# Patient Record
Sex: Female | Born: 1935 | Race: White | Hispanic: Yes | Smoking: Never smoker
Health system: Southern US, Community
[De-identification: ages and names within clinical notes are randomized; demographics above are authoritative.]

## PROBLEM LIST (undated history)

## (undated) DIAGNOSIS — E7800 Pure hypercholesterolemia, unspecified: Secondary | ICD-10-CM

## (undated) DIAGNOSIS — A048 Other specified bacterial intestinal infections: Secondary | ICD-10-CM

## (undated) DIAGNOSIS — J45909 Unspecified asthma, uncomplicated: Secondary | ICD-10-CM

## (undated) DIAGNOSIS — E079 Disorder of thyroid, unspecified: Secondary | ICD-10-CM

## (undated) DIAGNOSIS — N815 Vaginal enterocele: Secondary | ICD-10-CM

## (undated) DIAGNOSIS — R1900 Intra-abdominal and pelvic swelling, mass and lump, unspecified site: Secondary | ICD-10-CM

## (undated) DIAGNOSIS — N904 Leukoplakia of vulva: Secondary | ICD-10-CM

## (undated) DIAGNOSIS — E039 Hypothyroidism, unspecified: Secondary | ICD-10-CM

## (undated) DIAGNOSIS — J309 Allergic rhinitis, unspecified: Secondary | ICD-10-CM

## (undated) DIAGNOSIS — K439 Ventral hernia without obstruction or gangrene: Secondary | ICD-10-CM

## (undated) DIAGNOSIS — M810 Age-related osteoporosis without current pathological fracture: Secondary | ICD-10-CM

## (undated) DIAGNOSIS — K219 Gastro-esophageal reflux disease without esophagitis: Secondary | ICD-10-CM

## (undated) HISTORY — PX: ABDOMINAL HYSTERECTOMY: SHX81

## (undated) HISTORY — DX: Pure hypercholesterolemia, unspecified: E78.00

## (undated) HISTORY — PX: HERNIA REPAIR: SHX51

## (undated) HISTORY — DX: Disorder of thyroid, unspecified: E07.9

## (undated) HISTORY — DX: Gastro-esophageal reflux disease without esophagitis: K21.9

## (undated) HISTORY — DX: Leukoplakia of vulva: N90.4

## (undated) HISTORY — DX: Age-related osteoporosis without current pathological fracture: M81.0

## (undated) HISTORY — DX: Allergic rhinitis, unspecified: J30.9

## (undated) HISTORY — DX: Intra-abdominal and pelvic swelling, mass and lump, unspecified site: R19.00

## (undated) HISTORY — DX: Unspecified asthma, uncomplicated: J45.909

## (undated) HISTORY — DX: Vaginal enterocele: N81.5

## (undated) HISTORY — DX: Ventral hernia without obstruction or gangrene: K43.9

## (undated) HISTORY — DX: Other specified bacterial intestinal infections: A04.8

---

## 1969-05-31 HISTORY — PX: BREAST EXCISIONAL BIOPSY: SUR124

## 2013-06-15 ENCOUNTER — Ambulatory Visit: Payer: Self-pay | Admitting: Obstetrics and Gynecology

## 2013-07-27 ENCOUNTER — Ambulatory Visit: Payer: Self-pay | Admitting: Ophthalmology

## 2013-08-03 ENCOUNTER — Ambulatory Visit: Payer: Self-pay | Admitting: Ophthalmology

## 2013-08-27 ENCOUNTER — Emergency Department: Payer: Self-pay | Admitting: Emergency Medicine

## 2013-08-27 LAB — BASIC METABOLIC PANEL
Anion Gap: 5 — ABNORMAL LOW (ref 7–16)
Calcium, Total: 8.9 mg/dL (ref 8.5–10.1)
Creatinine: 0.75 mg/dL (ref 0.60–1.30)
EGFR (Non-African Amer.): >60
Glucose: 125 mg/dL — ABNORMAL HIGH (ref 65–99)
Potassium: 3.8 mmol/L (ref 3.5–5.1)
Sodium: 137 mmol/L (ref 136–145)

## 2013-08-27 LAB — CBC
HCT: 41 % (ref 35.0–47.0)
Platelet: 285 10*3/uL (ref 150–440)
RBC: 4.73 10*6/uL (ref 3.80–5.20)
WBC: 6.8 10*3/uL (ref 3.6–11.0)

## 2013-08-27 LAB — URINALYSIS, COMPLETE
Bacteria: NONE SEEN
Glucose,UR: NEGATIVE mg/dL (ref 0–75)
Ketone: NEGATIVE
Leukocyte Esterase: NEGATIVE
Nitrite: NEGATIVE
Protein: NEGATIVE
RBC,UR: 8 /HPF (ref 0–5)
Specific Gravity: 1.013 (ref 1.003–1.030)
Squamous Epithelial: <1
WBC UR: 1 /HPF (ref 0–5)

## 2013-08-27 LAB — TROPONIN I: Troponin-I: <0.02 ng/mL

## 2013-10-12 ENCOUNTER — Ambulatory Visit: Payer: Self-pay | Admitting: Ophthalmology

## 2014-03-16 ENCOUNTER — Ambulatory Visit: Payer: Self-pay | Admitting: Internal Medicine

## 2015-01-20 NOTE — Op Note (Signed)
PATIENT NAME:  Cassandra Gomez, Cassandra Gomez MR#:  [id number] DATE OF BIRTH:  06/06/1936  DATE OF PROCEDURE:  08/03/2013  PREOPERATIVE DIAGNOSIS: Visually significant cataract of the right eye.   POSTOPERATIVE DIAGNOSIS: Visually significant cataract of the right eye.   OPERATIVE PROCEDURE: Cataract extraction by phacoemulsification with implant of intraocular lens to right eye.   SURGEON: Galen ManilaWilliam Estephan Gallardo, MD.   ANESTHESIA:  1. Managed anesthesia care.  2. Topical tetracaine drops followed by 2% Xylocaine jelly applied in the preoperative holding area.   COMPLICATIONS: None.   TECHNIQUE:  Stop and chop.  DESCRIPTION OF PROCEDURE: The patient was examined and consented in the preoperative holding area where the aforementioned topical anesthesia was applied to the right eye and then brought back to the Operating Room where the right eye was prepped and draped in the usual sterile ophthalmic fashion and a lid speculum was placed. A paracentesis was created with the side port blade and the anterior chamber was filled with viscoelastic. A near clear corneal incision was performed with the steel keratome. A continuous curvilinear capsulorrhexis was performed with a cystotome followed by the capsulorrhexis forceps. Hydrodissection and hydrodelineation were carried out with BSS on a blunt cannula. The lens was removed in a stop and chop technique and the remaining cortical material was removed with the irrigation-aspiration handpiece. The capsular bag was inflated with viscoelastic and the Tecnis ZCB00 23.0-diopter lens, serial number [id number] was placed in the capsular bag without complication. The remaining viscoelastic was removed from the eye with the irrigation-aspiration handpiece. The wounds were hydrated. The anterior chamber was flushed with Miostat and the eye was inflated to physiologic pressure. 0.1 mL of cefuroxime concentration 10 mg/mL was placed in the anterior chamber. The wounds were found to  be water tight. The eye was dressed with Vigamox. The patient was given protective glasses to wear throughout the day and a shield with which to sleep tonight. The patient was also given drops with which to begin a drop regimen today and will follow-up with me in one day.     ____________________________ Cassandra FieldWilliam L. Kiarra Kidd, MD wlp:dmm D: 08/03/2013 21:22:11 ET T: 08/03/2013 21:46:15 ET JOB#: [id number]  cc: Ishanvi Mcquitty L. Nell Gales, MD, <Dictator> Cassandra FieldWILLIAM L Ilani Otterson MD ELECTRONICALLY SIGNED 08/06/2013 11:25

## 2015-01-21 NOTE — Op Note (Signed)
PATIENT NAME:  Cassandra Gomez, Cassandra Gomez MR#:  [id number] DATE OF BIRTH:  1935/11/23  DATE OF PROCEDURE:  10/12/2013  PREOPERATIVE DIAGNOSIS: Visually significant cataract of the left eye.   POSTOPERATIVE DIAGNOSIS: Visually significant cataract of the left eye.   OPERATIVE PROCEDURE: Cataract extraction by phacoemulsification with implant of intraocular lens to left eye.   SURGEON: Cassandra ManilaWilliam Ninfa Giannelli, MD.   ANESTHESIA:  1. Managed anesthesia care.  2. Topical tetracaine drops followed by 2% Xylocaine jelly applied in the preoperative holding area.   COMPLICATIONS: None.   TECHNIQUE:   Stop and chop  DESCRIPTION OF PROCEDURE: The patient was examined and consented in the preoperative holding area where the aforementioned topical anesthesia was applied to the left eye and then brought back to the Operating Room where the left eye was prepped and draped in the usual sterile ophthalmic fashion and a lid speculum was placed. A paracentesis was created with the side port blade and the anterior chamber was filled with viscoelastic. A near clear corneal incision was performed with the steel keratome. A continuous curvilinear capsulorrhexis was performed with a cystotome followed by the capsulorrhexis forceps. Hydrodissection and hydrodelineation were carried out with BSS on a blunt cannula. The lens was removed in a stop and chop technique and the remaining cortical material was removed with the irrigation-aspiration handpiece. The capsular bag was inflated with viscoelastic and the Alcon SN60WF Tecnis ZCB00 23.0-diopter lens, serial number [id number] was placed in the capsular bag without complication. The remaining viscoelastic was removed from the eye with the irrigation-aspiration handpiece. The wounds were hydrated. The anterior chamber was flushed with Miostat and the eye was inflated to physiologic pressure. 0.1 mL of cefuroxime concentration 10 mg/mL was placed in the anterior chamber. The wounds were  found to be water tight. The eye was dressed with Vigamox. The patient was given protective glasses to wear throughout the day and a shield with which to sleep tonight. The patient was also given drops with which to begin a drop regimen today and will follow-up with me in one day.    ____________________________ Jerilee FieldWilliam L. Hendricks Schwandt, MD wlp:ea D: 10/12/2013 22:42:30 ET T: 10/12/2013 23:07:52 ET JOB#: [id number]  cc: Celicia Minahan L. Ohanna Gassert, MD, <Dictator> Jerilee FieldWILLIAM L Edmar Blankenburg MD ELECTRONICALLY SIGNED 10/13/2013 9:41

## 2015-02-06 ENCOUNTER — Other Ambulatory Visit: Payer: Self-pay | Admitting: Internal Medicine

## 2015-02-06 DIAGNOSIS — Z1239 Encounter for other screening for malignant neoplasm of breast: Secondary | ICD-10-CM

## 2015-03-28 ENCOUNTER — Other Ambulatory Visit: Payer: Self-pay | Admitting: Internal Medicine

## 2015-03-28 ENCOUNTER — Ambulatory Visit
Admission: RE | Admit: 2015-03-28 | Discharge: 2015-03-28 | Disposition: A | Discharge status: Home / Self Care | Payer: Medicare Other | Source: Ambulatory Visit | Attending: Internal Medicine | Admitting: Internal Medicine

## 2015-03-28 DIAGNOSIS — Z1231 Encounter for screening mammogram for malignant neoplasm of breast: Secondary | ICD-10-CM | POA: Diagnosis not present

## 2015-03-28 DIAGNOSIS — Z1239 Encounter for other screening for malignant neoplasm of breast: Secondary | ICD-10-CM

## 2015-04-05 ENCOUNTER — Ambulatory Visit (INDEPENDENT_AMBULATORY_CARE_PROVIDER_SITE_OTHER): Payer: Medicare Other | Admitting: Obstetrics and Gynecology

## 2015-04-05 ENCOUNTER — Encounter: Payer: Self-pay | Admitting: Obstetrics and Gynecology

## 2015-04-05 VITALS — BP 114/74 | HR 67 | Ht 63.0 in | Wt 114.7 lb

## 2015-04-05 DIAGNOSIS — N8110 Cystocele, unspecified: Secondary | ICD-10-CM

## 2015-04-05 DIAGNOSIS — IMO0002 Reserved for concepts with insufficient information to code with codable children: Secondary | ICD-10-CM

## 2015-04-05 DIAGNOSIS — N815 Vaginal enterocele: Secondary | ICD-10-CM | POA: Insufficient documentation

## 2015-04-05 DIAGNOSIS — L900 Lichen sclerosus et atrophicus: Secondary | ICD-10-CM | POA: Insufficient documentation

## 2015-04-05 DIAGNOSIS — N993 Prolapse of vaginal vault after hysterectomy: Secondary | ICD-10-CM

## 2015-04-05 DIAGNOSIS — K439 Ventral hernia without obstruction or gangrene: Secondary | ICD-10-CM | POA: Diagnosis not present

## 2015-04-05 NOTE — Progress Notes (Signed)
Patient ID: Cassandra Gomez, female   DOB: 07/18/1936, 79 y.o.   MRN: [id number]   6 week f/u vulvar itching clobetasol qd- sx under control Enterocele  GYN ENCOUNTER NOTE  Subjective:       Cassandra Gomez is a 79 y.o. No obstetric history on file. female is here for gynecologic evaluation of the following issues:  1. F/U on LSA 2. Vaginal Vault Prolapse 3. Abdominal wall Ventral Hernia    Gynecologic History No LMP recorded. Patient has had a hysterectomy.   Obstetric History OB History  No data available    Past Medical History  Diagnosis Date  . Abdominal mass   . Hypercholesteremia   . Asthma   . Osteoporosis   . Vulvar leukoplakia   . Vaginal enterocele   . GERD (gastroesophageal reflux disease)   . Thyroid disease     hypo    Past Surgical History  Procedure Laterality Date  . Abdominal hysterectomy    . Breast excisional biopsy Right     negative 1970's  . Hernia repair      No current outpatient prescriptions on file prior to visit.   No current facility-administered medications on file prior to visit.    No Known Allergies  History   Social History  . Marital Status: Single    Spouse Name: N/A  . Number of Children: N/A  . Years of Education: N/A   Occupational History  . Not on file.   Social History Main Topics  . Smoking status: Never Smoker   . Smokeless tobacco: Not on file  . Alcohol Use: No  . Drug Use: No  . Sexual Activity: No   Other Topics Concern  . Not on file   Social History Narrative    Family History  Problem Relation Age of Onset  . Breast cancer Sister [id number]    two sisters breast cancer  . Ovarian cancer Sister   . Diabetes Neg Hx   . Heart disease Neg Hx     The following portions of the patient's history were reviewed and updated as appropriate: allergies, current medications, past family history, past medical history, past social history, past surgical history and problem list.  Review of Systems Review of  Systems - General ROS: negative for - chills, fatigue, fever, hot flashes, malaise or night sweats Hematological and Lymphatic ROS: negative for - bleeding problems or swollen lymph nodes Gastrointestinal ROS: negative for -  blood in stools, change in bowel habits and nausea/vomiting. POSITIVE-abdominal pain Musculoskeletal ROS: negative for - joint pain, muscle pain or muscular weakness Genito-Urinary ROS: negative for -  genital discharge, genital ulcers, hematuria, incontinence, nocturia or pelvic pain. POSITIVE-Vaginal vault prolapse.  Objective:   BP 114/74 mmHg  Pulse 67  Ht 5\' 3"  (1.6 m)  Wt 114 lb 11.2 oz (52.028 kg)  BMI 20.32 kg/m2 CONSTITUTIONAL: Well-developed, well-nourished female in no acute distress.  HENT:  Normocephalic, atraumatic.  SKIN: Skin is warm and dry. No rash noted. Not diaphoretic. No erythema. No pallor.  PSYCHIATRIC: Normal mood and affect. Normal behavior.  CARDIOVASCULAR:Not Examined RESPIRATORY: Not Examined BREASTS: Not Examined ABDOMEN: Soft, non distended; Non tender.  No Organomegaly. Rt sided abdominal wall discomfort to palpation. PELVIC:  External Genitalia: Atrophic; minimal leukoplakia at poosterior fourchette; Previously noted periclitoral leukoplakia resolved.  BUS: Normal  Vagina: Atrophic; vaginal forshortened; Enterocele is present with vaginal vault prolapsed during valsalva  Cervix: surgically absent  Uterus: surgically absent  Adnexa: nonpalpable, nontender  RV: Normal external  exam  Bladder: Nontender     Assessment:   1. Prolapse of vaginal vault after hysterectomy  - Ambulatory referral to Gynecologic Oncology  2. Cystocele - Ambulatory referral to Gynecologic Oncology  3. Lichen sclerosus-Stable  4. Vaginal enterocele  5. Ventral hernia, recurrence not specified      Plan:   1.  Patient may continue clobetasol ointment to vulva twice weekly for symptom control. 2.  Patient has vaginal enterocele and may be  in need of LeFort colpocleisis procedure.  I will refer her to GYN oncology, or urogynecology for further management.  This can possibly be done at the same time  as her symptomatic abdominal wall hernias are repaired.

## 2015-04-06 ENCOUNTER — Ambulatory Visit: Payer: Self-pay | Admitting: Obstetrics and Gynecology

## 2015-04-08 ENCOUNTER — Emergency Department
Admission: EM | Admit: 2015-04-08 | Discharge: 2015-04-08 | Disposition: A | Discharge status: Home / Self Care | Payer: Medicare Other | Attending: Emergency Medicine | Admitting: Emergency Medicine

## 2015-04-08 ENCOUNTER — Emergency Department: Payer: Medicare Other

## 2015-04-08 ENCOUNTER — Encounter: Payer: Self-pay | Admitting: General Practice

## 2015-04-08 ENCOUNTER — Other Ambulatory Visit: Payer: Self-pay

## 2015-04-08 DIAGNOSIS — R05 Cough: Secondary | ICD-10-CM | POA: Diagnosis present

## 2015-04-08 DIAGNOSIS — M545 Low back pain: Secondary | ICD-10-CM | POA: Insufficient documentation

## 2015-04-08 DIAGNOSIS — J209 Acute bronchitis, unspecified: Secondary | ICD-10-CM | POA: Diagnosis not present

## 2015-04-08 DIAGNOSIS — R109 Unspecified abdominal pain: Secondary | ICD-10-CM | POA: Insufficient documentation

## 2015-04-08 DIAGNOSIS — J40 Bronchitis, not specified as acute or chronic: Secondary | ICD-10-CM

## 2015-04-08 LAB — COMPREHENSIVE METABOLIC PANEL
ALT: 20 U/L (ref 14–54)
ANION GAP: 9 (ref 5–15)
AST: 26 U/L (ref 15–41)
Albumin: 4.2 g/dL (ref 3.5–5.0)
Alkaline Phosphatase: 105 U/L (ref 38–126)
BILIRUBIN TOTAL: 0.6 mg/dL (ref 0.3–1.2)
BUN: 8 mg/dL (ref 6–20)
CO2: 26 mmol/L (ref 22–32)
CREATININE: 0.53 mg/dL (ref 0.44–1.00)
Calcium: 8.9 mg/dL (ref 8.9–10.3)
Chloride: 99 mmol/L — ABNORMAL LOW (ref 101–111)
GFR calc Af Amer: >60 mL/min (ref >60)
GFR calc non Af Amer: >60 mL/min (ref >60)
Glucose, Bld: 89 mg/dL (ref 65–99)
Potassium: 3.9 mmol/L (ref 3.5–5.1)
Sodium: 134 mmol/L — ABNORMAL LOW (ref 135–145)
Total Protein: 7.7 g/dL (ref 6.5–8.1)

## 2015-04-08 LAB — CBC WITH DIFFERENTIAL/PLATELET
BASOS ABS: 0 10*3/uL (ref 0–0.1)
BASOS PCT: 1 %
Eosinophils Absolute: 0.6 10*3/uL (ref 0–0.7)
Eosinophils Relative: 11 %
HCT: 40.8 % (ref 35.0–47.0)
Hemoglobin: 13.7 g/dL (ref 12.0–16.0)
Lymphocytes Relative: 19 %
Lymphs Abs: 1 10*3/uL (ref 1.0–3.6)
MCH: 29.3 pg (ref 26.0–34.0)
MCHC: 33.6 g/dL (ref 32.0–36.0)
MCV: 87.1 fL (ref 80.0–100.0)
MONO ABS: 0.5 10*3/uL (ref 0.2–0.9)
MONOS PCT: 9 %
Neutro Abs: 3.1 10*3/uL (ref 1.4–6.5)
Neutrophils Relative %: 60 %
PLATELETS: 285 10*3/uL (ref 150–440)
RBC: 4.68 MIL/uL (ref 3.80–5.20)
RDW: 14.5 % (ref 11.5–14.5)
WBC: 5.2 10*3/uL (ref 3.6–11.0)

## 2015-04-08 LAB — TROPONIN I

## 2015-04-08 MED ORDER — AZITHROMYCIN 250 MG PO TABS: ORAL_TABLET | ORAL | Status: AC

## 2015-04-08 MED ORDER — BENZONATATE 100 MG PO CAPS
100.0000 mg | ORAL_CAPSULE | Freq: Once | ORAL | Status: AC
  Administered 2015-04-08: 100 mg via ORAL

## 2015-04-08 MED ORDER — BENZONATATE 100 MG PO CAPS: 100.0000 mg | ORAL_CAPSULE | Freq: Four times a day (QID) | ORAL | Status: DC | PRN

## 2015-04-08 MED ORDER — BENZONATATE 100 MG PO CAPS
ORAL_CAPSULE | ORAL | Status: AC
  Filled 2015-04-08: qty 1

## 2015-04-08 MED ORDER — ALBUTEROL SULFATE HFA 108 (90 BASE) MCG/ACT IN AERS: 2.0000 | INHALATION_SPRAY | Freq: Four times a day (QID) | RESPIRATORY_TRACT | Status: AC | PRN

## 2015-04-08 MED ORDER — BENZONATATE 100 MG PO CAPS
ORAL_CAPSULE | ORAL | Status: AC
  Filled 2015-04-08: qty 2

## 2015-04-08 NOTE — ED Provider Notes (Signed)
[facility] Emergency Department Provider Note  Time seen: 9:12 AM  I have reviewed the triage vital signs and the nursing notes.   HISTORY  Chief Complaint Cough and Back Pain    HPI Cassandra Gomez is a 79 y.o. female with a past medical history of hyperlipidemia, asthma, gastric reflux who presents the emergency department for worsening cough 2 days. According to the patient, and the patient's daughter she has had a cough for the past 7 months which has been occasional and dry. They've been seeing her primary care doctor who has placed her on an inhaler, and referred her to a specialist thinking that it could be gastric reflux related. The daughter states for the past 2 days the cough has become much worse, much more frequent and now the patient is having some right-sided abdominal pain with cough. They deny fever, denies sputum, denies chest pain. The patient complains that she has not been able to sleep due to the cough.     Past Medical History  Diagnosis Date  . Abdominal mass   . Hypercholesteremia   . Asthma   . Osteoporosis   . Vulvar leukoplakia   . Vaginal enterocele   . GERD (gastroesophageal reflux disease)   . Thyroid disease     hypo    Patient Active Problem List   Diagnosis Date Noted  . Lichen sclerosus 04/05/2015  . Vaginal enterocele 04/05/2015  . Ventral hernia 04/05/2015    Past Surgical History  Procedure Laterality Date  . Abdominal hysterectomy    . Breast excisional biopsy Right     negative 1970's  . Hernia repair      Current Outpatient Rx  Name  Route  Sig  Dispense  Refill  . CLOBETASOL PROPIONATE E 0.05 % emollient cream                 Dispense as written.   Marland Kitchen. CRESTOR 10 MG tablet                 Dispense as written.   Marland Kitchen. DEXILANT 60 MG capsule                 Dispense as written.   Marland Kitchen. levothyroxine (SYNTHROID, LEVOTHROID) 125 MCG tablet      112 mcg.          Marland Kitchen.  Umeclidinium-Vilanterol (ANORO ELLIPTA) 62.5-25 MCG/INH AEPB   Inhalation   Inhale into the lungs.           Allergies Review of patient's allergies indicates no known allergies.  Family History  Problem Relation Age of Onset  . Breast cancer Sister 2985    two sisters breast cancer  . Ovarian cancer Sister   . Diabetes Neg Hx   . Heart disease Neg Hx     Social History History  Substance Use Topics  . Smoking status: Never Smoker   . Smokeless tobacco: Not on file  . Alcohol Use: No    Review of Systems Constitutional: Negative for fever. ENT: Negative for congestion Cardiovascular: Negative for chest pain. Respiratory: Positive for frequent cough. Gastrointestinal: Mild right-sided abdominal pain only when coughing. Musculoskeletal: Negative for back pain. Skin: Negative for rash.  10-point ROS otherwise negative.  ____________________________________________   PHYSICAL EXAM:  VITAL SIGNS: ED Triage Vitals  Enc Vitals Group     BP 04/08/15 0901 142/90 mmHg     Pulse Rate 04/08/15 0901 76     Resp 04/08/15 0901 19  Temp 04/08/15 0901 99.2 F (37.3 C)     Temp Source 04/08/15 0901 Oral     SpO2 04/08/15 0901 97 %     Weight 04/08/15 0901 117 lb (53.071 kg)     Height 04/08/15 0901  (1.6 m)     Head Cir --      Peak Flow --      Pain Score 04/08/15 0902 8     Pain Loc --      Pain Edu? --      Excl. in GC? --     Constitutional: Alert and oriented. Well appearing and in no distress. ENT   Mouth/Throat: Mucous membranes are moist. Cardiovascular: Normal rate, regular rhythm. Respiratory: Normal respiratory effort without tachypnea nor retractions. Moderate wheeze bilaterally, with lower rhonchi auscultated bilaterally. Gastrointestinal: Soft and nontender. No distention.   Musculoskeletal: Nontender with normal range of motion in all extremitie Neurologic:  Normal speech and language. No gross focal neurologic deficits  Skin:  Skin is  warm, dry and intact.  Psychiatric: Mood and affect are normal. Speech and behavior are normal.  ____________________________________________    EKG  EKG reviewed and interpreted by myself shows normal sinus rhythm at 67 bpm, narrow QRS, normal axis, normal intervals, no ST changes noted. Overall normal EKG.  ____________________________________________    RADIOLOGY  Chest x-ray shows no acute cardiopulmonary process.  ____________________________________________    INITIAL IMPRESSION / ASSESSMENT AND PLAN / ED COURSE  Pertinent labs & imaging results that were available during my care of the patient were reviewed by me and considered in my medical decision making (see chart for details).  Patient was 7 months of intermittent cough, now with 2 days of much worse cough. We will check labs, EKG, chest x-ray. Patient in no distress, appears overall well, but with a frequent cough during examination.  Labs are largely within normal limits. Given the patient's acute increasing cough. We will discharge the patient on Zithromax, and an albuterol inhaler for presumed bronchitis. I discussed this with the patient and her daughter who are agreeable and will follow up with her primary care doctor.  ____________________________________________   FINAL CLINICAL IMPRESSION(S) / ED DIAGNOSES  Cough Bronchitis   Minna Antis, MD 04/08/15 1125

## 2015-04-08 NOTE — ED Notes (Signed)
Pt. Arrived to ed from home with daughter at bedside. Pt 's daughter reports that she has been experiencing coughing x months and has been followed up by PCP. Pt alert and oriented, intrepretor used. Pt denies productive cough. Daughter reports recently cough has worsen and pt is currently experiencing right lower back pain from the coughing.

## 2015-04-08 NOTE — Discharge Instructions (Signed)
Infeccin de las vas areas superiores en los adultos (Upper Respiratory Infection, Adult)  La infeccin respiratoria de las vas areas superiores se conoce tambin como resfro comn. Las vas areas superiores incluyen los senos nasales, la garganta, la trquea, y los bronquios. Los bronquios son las vas areas que conducen el aire a los pulmones. La mayor parte de las personas mejora luego de una semana, pero los sntomas pueden durar hasta dos semanas. La tos residual puede durar ms. CAUSAS Varios tipos de virus pueden causar la infeccin de los tejidos que cubren las vas areas superiores. Los tejidos se irritan y se inflaman y se originan secreciones. Tambin es frecuente la produccin de moco. El resfro es contagioso. El virus se disemina fcilmente a otras personas por contacto oral. Aqu se incluyen los besos, el compartir un vaso y el toser o estornudar. Tambin puede diseminarse tocndose la boca o la nariz y luego tocando una superficie que luego tocan otras personas.  SNTOMAS Los sntomas se desarrollan entre uno y tres das luego de entrar en contacto con el virus. Pueden variar de una persona a otra. Incluyen:  Secrecin nasal.  Estornudos  Congestin nasal.  Irritacin de los senos nasales.  Dolor de garganta.  Prdida de la voz (laringitis).  Tos.  Fatiga.  Dolores musculares.  Prdida del apetito.  Dolor de cabeza.  Fiebre no muy elevada. DIAGNSTICO Puede diagnosticarse a s mismo la infeccin respiratoria, segn los sntomas habituales, ya que la mayor parte de las personas se resfra dos o tres veces al ao. El profesional puede confirmarlo basndose en el examen fsico. Lo ms importante es que el profesional verifique que los sntomas no se deben a otra enfermedad como anginas, sinusitis, neumona, asma o epiglotitis. Para diagnosticar el resfrio comn, no es necesario que haga anlisis de sangre, pruebas en la garganta o radiografas, pero en algunos  casos puede ser de utilidad para excluir otros problemas ms graves. El mdico decidir si necesita otras pruebas. RIESGOS Y COMPLICACIONES Tendr mayor riesgo de sufrir un resfro grave si consume cigarrillos, sufre una enfermedad cardaca (como insuficiencia cardaca) o pulmonar crnica (como asma) o si tiene un debilitamiento del sistema inmunolgico. Las personas muy jvenes o muy mayores tienen riesgo de sufrir infecciones ms graves. La sinusitis bacteriana, las infecciones del odo medio y la neumona bacteriana pueden complicar el resfro comn. El resfro puede exacerbar el asma y la enfermedad pulmonar obstructiva crnica. En algunos casos estas complicaciones requieren la atencin en un servicio de emergencias y pueden poner en peligro la vida. PREVENCIN La mejor manera de protegerse para no contraer un resfro es mantener una buena higiene. Evite el contacto bucal o de las manos con personas con sntomas de resfro. Si se produce el contacto, lvese las manos con frecuencia. No hay pruebas firmes que indiquen que la vitamina C, la vitamina E, la equincea o la actividad fsica reduzcan las posibilidades de tener una infeccin. Sin embargo, siempre se recomienda descansar mucho y tener una buena nutricin. TRATAMIENTO El tratamiento est dirigido a aliviar los sntomas. Esta enfermedad no tiene cura. Los antibiticos no son eficaces, ya que esta infeccin la causa un virus y no una bacteria. El tratamiento incluye:  Aumente la ingesta de lquidos. Consumo de bebidas deportivas, que proporcionan electrolitos,azcares e hidratacin.  Inhale vapor caliente (de un vaporizador o de la ducha).  Tomar sopa de pollo u otros lquidos claros, y mantener una buena nutricin.  Descanse lo suficiente.  Haga grgaras o coma pastillas para aliviar   las molestias.  Control de la fiebre con ibuprofeno o acetaminofen, segn las indicaciones del mdico.  Aumento del uso del inhalador, si sufre asma. Las  pastillas y los geles de zinc durante las primeras 24 horas de iniciado el resfro comn, pueden disminuir la duracin y aliviar la gravedad de los sntomas. Los medicamentos para el dolor pueden disminuir la fiebre, aliviar los dolores musculares y el dolor de garganta. Se dispone de una gran variedad de medicamentos de venta libre para tratar la congestin y la secrecin nasal. El profesional podr recomendarle inhalantes para los otros sntomas. INSTRUCCIONES PARA EL CUIDADO DOMICILIARIO  Utilice los medicamentos de venta libre o de prescripcin para el dolor, el malestar o la fiebre, segn se lo indique el profesional que lo asiste.  Utilice un vaporizador caliente o inhale vapor, haciendo salir agua de la ducha para aumentar la humedad ambiente. Esto mantendr las secreciones hmedas y le resultar ms fcil respirar.  Beba gran cantidad de lquido para mantener la orina de tono claro o color amarillo plido.  Descanse todo lo que pueda.  Regrese a su trabajo cuando la temperatura se haya normalizado, o cuando el profesional que lo asiste se lo indique. Quizs sea necesario que permanezca en su casa durante un tiempo ms prolongado para evitar infectar a otras personas. Tambin puede utilizar un barbijo y ser cuidadoso con el lavado de manos para evitar la diseminacin del virus. SOLICITE ATENCIN MDICA SI:  Luego de los primeros das siente que empeora en vez de mejorar.  Necesita que el profesional le brinde ms informacin relacionada con los medicamentos para controlar los sntomas.  Siente escalofros, le falta el aire o escupe moco de color marrn o rojo. Estos pueden ser sntomas de neumona.  Tiene una secrecin nasal de color amarillo o marrn, o siente dolor en el rostro, especialmente cuando se inclina hacia adelante. Estos pueden ser sntomas de sinusitis.  Tiene fiebre, siente el cuello hinchado, tiene dolor al tragar u observa manchas blancas en el fondo de la garganta.  Estos pueden ser sntomas de angina por estreptococo. SOLICITE ATENCIN MDICA DE INMEDIATO SI:  Tiene fiebre.  Comienza a sentir un dolor de cabeza intenso o persistente, dolor de odos, en el seno nasal o en el pecho.  Tiene tos y esta se prolonga demasiado, tose y escupe sangre, la mucosidad habitual se modifica (si tiene una enfermedad pulmonar crnica) o respira con dificultad.  Siente rigidez en el cuello o dolor de cabeza intenso. Document Released: 06/26/2005 Document Revised: 12/09/2011 [employer] Patient Information 2015 [employer]. This information is not intended to replace advice given to you by your health care provider. Make sure you discuss any questions you have with your health care provider.  

## 2015-04-19 ENCOUNTER — Other Ambulatory Visit: Payer: Self-pay | Admitting: Internal Medicine

## 2015-04-19 DIAGNOSIS — Z889 Allergy status to unspecified drugs, medicaments and biological substances status: Secondary | ICD-10-CM

## 2015-04-19 DIAGNOSIS — K219 Gastro-esophageal reflux disease without esophagitis: Secondary | ICD-10-CM

## 2015-04-19 DIAGNOSIS — R05 Cough: Secondary | ICD-10-CM

## 2015-04-19 DIAGNOSIS — R059 Cough, unspecified: Secondary | ICD-10-CM

## 2015-04-19 DIAGNOSIS — IMO0001 Reserved for inherently not codable concepts without codable children: Secondary | ICD-10-CM

## 2015-05-02 ENCOUNTER — Other Ambulatory Visit: Payer: Medicare Other

## 2015-05-02 ENCOUNTER — Ambulatory Visit: Payer: Medicare Other | Attending: Internal Medicine

## 2015-06-13 ENCOUNTER — Other Ambulatory Visit: Payer: Self-pay | Admitting: Internal Medicine

## 2015-06-13 ENCOUNTER — Ambulatory Visit
Admission: RE | Admit: 2015-06-13 | Discharge: 2015-06-13 | Disposition: A | Discharge status: Home / Self Care | Payer: Medicare Other | Source: Ambulatory Visit | Attending: Internal Medicine | Admitting: Internal Medicine

## 2015-06-13 ENCOUNTER — Ambulatory Visit: Payer: Medicare Other

## 2015-06-13 ENCOUNTER — Ambulatory Visit: Payer: Medicare Other | Admitting: Obstetrics and Gynecology

## 2015-06-13 DIAGNOSIS — R05 Cough: Secondary | ICD-10-CM

## 2015-06-13 DIAGNOSIS — K219 Gastro-esophageal reflux disease without esophagitis: Secondary | ICD-10-CM | POA: Insufficient documentation

## 2015-06-13 DIAGNOSIS — R059 Cough, unspecified: Secondary | ICD-10-CM

## 2015-06-13 DIAGNOSIS — Z889 Allergy status to unspecified drugs, medicaments and biological substances status: Secondary | ICD-10-CM

## 2015-06-13 DIAGNOSIS — IMO0001 Reserved for inherently not codable concepts without codable children: Secondary | ICD-10-CM

## 2015-06-13 DIAGNOSIS — K228 Other specified diseases of esophagus: Secondary | ICD-10-CM | POA: Insufficient documentation

## 2015-07-04 ENCOUNTER — Encounter: Payer: Self-pay | Admitting: Obstetrics and Gynecology

## 2015-07-04 ENCOUNTER — Ambulatory Visit (INDEPENDENT_AMBULATORY_CARE_PROVIDER_SITE_OTHER): Payer: Medicare Other | Admitting: Obstetrics and Gynecology

## 2015-07-04 VITALS — BP 130/75 | HR 84 | Ht 63.0 in | Wt 114.2 lb

## 2015-07-04 DIAGNOSIS — L900 Lichen sclerosus et atrophicus: Secondary | ICD-10-CM | POA: Diagnosis not present

## 2015-07-04 DIAGNOSIS — N815 Vaginal enterocele: Secondary | ICD-10-CM | POA: Diagnosis not present

## 2015-07-04 DIAGNOSIS — N993 Prolapse of vaginal vault after hysterectomy: Secondary | ICD-10-CM

## 2015-07-04 DIAGNOSIS — J45909 Unspecified asthma, uncomplicated: Secondary | ICD-10-CM | POA: Insufficient documentation

## 2015-07-04 DIAGNOSIS — E079 Disorder of thyroid, unspecified: Secondary | ICD-10-CM | POA: Insufficient documentation

## 2015-07-04 DIAGNOSIS — K219 Gastro-esophageal reflux disease without esophagitis: Secondary | ICD-10-CM | POA: Insufficient documentation

## 2015-07-04 DIAGNOSIS — E785 Hyperlipidemia, unspecified: Secondary | ICD-10-CM | POA: Insufficient documentation

## 2015-07-04 DIAGNOSIS — K449 Diaphragmatic hernia without obstruction or gangrene: Secondary | ICD-10-CM | POA: Insufficient documentation

## 2015-07-04 NOTE — Progress Notes (Signed)
Patient ID: Cassandra Gomez, female   DOB: 04-16-1936, 79 y.o.   MRN: [id number]   3 month f/u on Ls - using clobetasol as needed Will see Dr at [facility] to repair hernia before gyn surgery is to be done See care everywhere  Chief complaint: 1.  Lichen sclerosus. 2.  Pelvic organ prolapse.  SUBJECTIVE: Patient presents for follow-up on lichen sclerosus.  She is asymptomatic at this time and is only applying the medication approximately once every 2 weeks. Patient does have surgery scheduled with Dr. Doy Hutching at [facility] for a LeFort procedure.  Past medical history, past surgical history, problem list, family history, social history, medications, and allergies reviewed and updated  Review of Systems  Constitutional: Negative.   Gastrointestinal: Negative.   Musculoskeletal: Negative.   Skin: Negative.   Endo/Heme/Allergies: Negative.      OBJECTIVE: BP 130/75 mmHg  Pulse 84  Ht  (1.6 m)  Wt 114 lb 3.2 oz (51.801 kg)  BMI 20.23 kg/m2   PELVIC: External Genitalia: Atrophic; Previously noted periclitoral leukoplakia and posterior fourchette leukoplakia is now resolved. BUS: Normal Vagina: Atrophic; vaginal forshortened; Enterocele is present with vaginal vault prolapsed during valsalva Cervix: surgically absent Uterus: surgically absent Adnexa: nonpalpable, nontender RV: Normal external exam Bladder: Nontender  ASSESSMENT: 1.  Lichen sclerosus-stable. 2.  Pelvic organ prolapse, surgery pending.  PLAN: 1.  Continue clobetasol ointment weekly. 2.  Return as needed if irritative vulvar symptoms return. 3.  Return in 6 months for follow-up. 4.  Follow up with Dr. Doy Hutching at [facility] for LeFort colpocleisis procedure.  Herold Harms, MD

## 2015-07-04 NOTE — Patient Instructions (Signed)
1.  Continue using clobetasol ointment once a week. 2.  If vulvar irritation/itching worsens, please contact us for adjustment in medication dosing. 3.  Return in 6 months for follow-up. 4.  Follow up with Dr. Doy Hutching at [facility] for colpocleisis procedure.

## 2015-08-04 ENCOUNTER — Encounter: Payer: Self-pay | Admitting: *Deleted

## 2015-08-07 ENCOUNTER — Ambulatory Visit: Payer: Medicare Other | Admitting: Certified Registered"

## 2015-08-07 ENCOUNTER — Ambulatory Visit
Admission: RE | Admit: 2015-08-07 | Discharge: 2015-08-07 | Disposition: A | Discharge status: Home / Self Care | Payer: Medicare Other | Source: Ambulatory Visit | Attending: Gastroenterology | Admitting: Gastroenterology

## 2015-08-07 ENCOUNTER — Encounter: Payer: Self-pay | Admitting: *Deleted

## 2015-08-07 ENCOUNTER — Encounter
Admission: RE | Disposition: A | Discharge status: Home / Self Care | Payer: Self-pay | Source: Ambulatory Visit | Attending: Gastroenterology

## 2015-08-07 DIAGNOSIS — R05 Cough: Secondary | ICD-10-CM | POA: Diagnosis not present

## 2015-08-07 DIAGNOSIS — R140 Abdominal distension (gaseous): Secondary | ICD-10-CM | POA: Diagnosis present

## 2015-08-07 DIAGNOSIS — J45909 Unspecified asthma, uncomplicated: Secondary | ICD-10-CM | POA: Insufficient documentation

## 2015-08-07 DIAGNOSIS — Z1211 Encounter for screening for malignant neoplasm of colon: Secondary | ICD-10-CM | POA: Insufficient documentation

## 2015-08-07 DIAGNOSIS — K449 Diaphragmatic hernia without obstruction or gangrene: Secondary | ICD-10-CM | POA: Diagnosis not present

## 2015-08-07 DIAGNOSIS — E7800 Pure hypercholesterolemia, unspecified: Secondary | ICD-10-CM | POA: Diagnosis not present

## 2015-08-07 DIAGNOSIS — Z79899 Other long term (current) drug therapy: Secondary | ICD-10-CM | POA: Insufficient documentation

## 2015-08-07 DIAGNOSIS — K640 First degree hemorrhoids: Secondary | ICD-10-CM | POA: Insufficient documentation

## 2015-08-07 DIAGNOSIS — J309 Allergic rhinitis, unspecified: Secondary | ICD-10-CM | POA: Diagnosis not present

## 2015-08-07 DIAGNOSIS — M810 Age-related osteoporosis without current pathological fracture: Secondary | ICD-10-CM | POA: Insufficient documentation

## 2015-08-07 DIAGNOSIS — K219 Gastro-esophageal reflux disease without esophagitis: Secondary | ICD-10-CM | POA: Diagnosis not present

## 2015-08-07 DIAGNOSIS — E039 Hypothyroidism, unspecified: Secondary | ICD-10-CM | POA: Insufficient documentation

## 2015-08-07 DIAGNOSIS — K5730 Diverticulosis of large intestine without perforation or abscess without bleeding: Secondary | ICD-10-CM | POA: Diagnosis not present

## 2015-08-07 HISTORY — PX: ESOPHAGOGASTRODUODENOSCOPY (EGD) WITH PROPOFOL: SHX5813

## 2015-08-07 HISTORY — DX: Hypothyroidism, unspecified: E03.9

## 2015-08-07 HISTORY — PX: COLONOSCOPY WITH PROPOFOL: SHX5780

## 2015-08-07 SURGERY — COLONOSCOPY WITH PROPOFOL
Anesthesia: General

## 2015-08-07 MED ORDER — PROPOFOL 10 MG/ML IV BOLUS
INTRAVENOUS | Status: DC | PRN
  Administered 2015-08-07: 20 mg via INTRAVENOUS
  Administered 2015-08-07: 50 mg via INTRAVENOUS

## 2015-08-07 MED ORDER — LIDOCAINE HCL (CARDIAC) 20 MG/ML IV SOLN
INTRAVENOUS | Status: DC | PRN
  Administered 2015-08-07: 50 mg via INTRAVENOUS

## 2015-08-07 MED ORDER — PROPOFOL 500 MG/50ML IV EMUL
INTRAVENOUS | Status: DC | PRN
  Administered 2015-08-07: 120 ug/kg/min via INTRAVENOUS

## 2015-08-07 MED ORDER — SODIUM CHLORIDE 0.9 % IV SOLN
INTRAVENOUS | Status: DC
  Administered 2015-08-07: 1000 mL via INTRAVENOUS

## 2015-08-07 NOTE — H&P (Signed)
  Primary Care Physician:  Derwood KaplanEason,  Ernest B, MD  Pre-Procedure History & Physical: HPI:  Cassandra Gomez is a 79 y.o. female is here for an colonoscopy/ EGD   Past Medical History  Diagnosis Date  . Abdominal mass   . Hypercholesteremia   . Asthma   . Osteoporosis   . Vulvar leukoplakia   . Vaginal enterocele   . GERD (gastroesophageal reflux disease)   . Thyroid disease     hypo  . AR (allergic rhinitis)   . Hernia, epigastric   . H. pylori infection     completed atb 07/03/2015  . Hypothyroidism     Past Surgical History  Procedure Laterality Date  . Abdominal hysterectomy    . Breast excisional biopsy Right     negative 1970's  . Hernia repair      Prior to Admission medications   Medication Sig Start Date End Date Taking? Authorizing Provider  pantoprazole (PROTONIX) 40 MG tablet Take 40 mg by mouth daily.   Yes Historical Provider, MD  albuterol (PROVENTIL HFA;VENTOLIN HFA) 108 (90 BASE) MCG/ACT inhaler Inhale 2 puffs into the lungs every 6 (six) hours as needed for wheezing or shortness of breath. 04/08/15   Minna AntisKevin Paduchowski, MD  CLOBETASOL PROPIONATE E 0.05 % emollient cream  02/23/15   Historical Provider, MD  CRESTOR 10 MG tablet  03/15/15   Historical Provider, MD  DEXILANT 60 MG capsule  03/15/15   Historical Provider, MD  levothyroxine (SYNTHROID, LEVOTHROID) 125 MCG tablet 112 mcg.  03/15/15   Historical Provider, MD  Umeclidinium-Vilanterol (ANORO ELLIPTA) 62.5-25 MCG/INH AEPB Inhale into the lungs.    Historical Provider, MD    Allergies as of 06/27/2015  . (No Known Allergies)    Family History  Problem Relation Age of Onset  . Breast cancer Sister [id number]    two sisters breast cancer  . Ovarian cancer Sister   . Diabetes Neg Hx   . Heart disease Neg Hx     Social History   Social History  . Marital Status: Widowed    Spouse Name: N/A  . Number of Children: N/A  . Years of Education: N/A   Occupational History  . Not on file.   Social History Main  Topics  . Smoking status: Never Smoker   . Smokeless tobacco: Not on file  . Alcohol Use: No  . Drug Use: No  . Sexual Activity: No   Other Topics Concern  . Not on file   Social History Narrative     Physical Exam: BP 129/83 mmHg  Pulse 86  Temp(Src) 97.6 F (36.4 C) (Oral)  Resp 18  Ht 4\' 2"  (1.27 m)  Wt 51.71 kg (114 lb)  BMI 32.06 kg/m2  SpO2 98% General:   Alert,  pleasant and cooperative in NAD Head:  Normocephalic and atraumatic. Neck:  Supple; no masses or thyromegaly. Lungs:  Clear throughout to auscultation.    Heart:  Regular rate and rhythm. Abdomen:  Soft, nontender and nondistended. Normal bowel sounds, without guarding, and without rebound.   Neurologic:  Alert and  oriented x4;  grossly normal neurologically.  Impression/Plan: Cassandra Gomez is here for an colonoscopy to be performed for screening, EGD for cough, abd bloating  Risks, benefits, limitations, and alternatives regarding  Colonoscopy/EGD have been reviewed with the patient.  Questions have been answered.  All parties agreeable.   Elnita MaxwellEIN, MATTHEW GORDON, MD  08/07/2015, 9:50 AM

## 2015-08-07 NOTE — Anesthesia Preprocedure Evaluation (Addendum)
Anesthesia Evaluation  Patient identified by MRN, date of birth, ID band Patient awake    Reviewed: Allergy & Precautions, H&P , NPO status , Patient's Chart, lab work & pertinent test results, reviewed documented beta blocker date and time   History of Anesthesia Complications (+) PONV and history of anesthetic complications  Airway Mallampati: III  TM Distance: >3 FB Neck ROM: full    Dental no notable dental hx. (+) Edentulous Upper, Edentulous Lower   Pulmonary neg shortness of breath, asthma , neg sleep apnea, neg COPD, neg recent URI,    Pulmonary exam normal breath sounds clear to auscultation       Cardiovascular Exercise Tolerance: Good negative cardio ROS Normal cardiovascular exam Rhythm:regular Rate:Normal     Neuro/Psych negative neurological ROS  negative psych ROS   GI/Hepatic Neg liver ROS, hiatal hernia, GERD  Medicated and Controlled,  Endo/Other  neg diabetesHypothyroidism   Renal/GU negative Renal ROS  negative genitourinary   Musculoskeletal   Abdominal   Peds  Hematology negative hematology ROS (+)   Anesthesia Other Findings Past Medical History:   Abdominal mass                                               Hypercholesteremia                                           Asthma                                                       Osteoporosis                                                 Vulvar leukoplakia                                           Vaginal enterocele                                           GERD (gastroesophageal reflux disease)                       Thyroid disease                                                Comment:hypo   AR (allergic rhinitis)                                       Hernia, epigastric  H. pylori infection                                            Comment:completed atb 07/03/2015   Hypothyroidism                                                Reproductive/Obstetrics negative OB ROS                            Anesthesia Physical Anesthesia Plan  ASA: II  Anesthesia Plan: General   Post-op Pain Management:    Induction:   Airway Management Planned:   Additional Equipment:   Intra-op Plan:   Post-operative Plan:   Informed Consent: I have reviewed the patients History and Physical, chart, labs and discussed the procedure including the risks, benefits and alternatives for the proposed anesthesia with the patient or authorized representative who has indicated his/her understanding and acceptance.   Dental Advisory Given  Plan Discussed with: Anesthesiologist, CRNA and Surgeon  Anesthesia Plan Comments:         Anesthesia Quick Evaluation

## 2015-08-07 NOTE — Op Note (Signed)
[facility] Gastroenterology Patient Name: Cassandra Gomez Procedure Date: 08/07/2015 10:05 AM MRN: [id number] Account #: [id number] Date of Birth: January 10, 1936 Admit Type: Outpatient Age: 79 Room: [facility] ENDO ROOM 2 Gender: Female Note Status: Finalized Procedure:         Upper GI endoscopy Indications:       Abnormal UGI series, , Abdominal bloating, Chronic                     cough(hiatal hernia) Patient Profile:   This is a 79 year old female. Providers:         Rhona Raider. Shelle Iron, MD Referring MD:      Cassandra Sheller. Maryellen Pile, MD (Referring MD) Medicines:         Propofol per Anesthesia Complications:     No immediate complications. Procedure:         Pre-Anesthesia Assessment:                    - Prior to the procedure, a History and Physical was                     performed, and patient medications, allergies and                     sensitivities were reviewed. The patient's tolerance of                     previous anesthesia was reviewed.                    - Prior to the procedure, a History and Physical was                     performed, and patient medications, allergies and                     sensitivities were reviewed. The patient's tolerance of                     previous anesthesia was reviewed.                    After obtaining informed consent, the endoscope was passed                     under direct vision. Throughout the procedure, the                     patient's blood pressure, pulse, and oxygen saturations                     were monitored continuously. The Endoscope was introduced                     through the mouth, and advanced to the second part of                     duodenum. The upper GI endoscopy was accomplished without                     difficulty. The patient tolerated the procedure well. Findings:      A medium-sized hiatus hernia was present.      The stomach was normal.      The examined duodenum was normal. Impression:         -  Medium-sized hiatus hernia.                    - Normal stomach.                    - Normal examined duodenum.                    - No specimens collected. Recommendation:    - Perform a colonoscopy today.                    - Continue present medications.                    - perform 24 hour pH study to determine if chronic cough                     is caused by esophageal reflux.                    - The findings and recommendations were discussed with the                     patient.                    - The findings and recommendations were discussed with the                     patient's family. Procedure Code(s): --- Professional ---                    [id number], Esophagogastroduodenoscopy, flexible, transoral;                     diagnostic, including collection of specimen(s) by                     brushing or washing, when performed (separate procedure) CPT copyright 2014 [employer]. All rights reserved. The codes documented in this report are preliminary and upon coder review may  be revised to meet current compliance requirements. Cassandra FramesMatthew G Molleigh Huot, MD 08/07/2015 10:13:47 AM This report has been signed electronically. Number of Addenda: 0 Note Initiated On: 08/07/2015 10:05 AM      [facility]

## 2015-08-07 NOTE — Transfer of Care (Signed)
Immediate Anesthesia Transfer of Care Note  Patient: Cassandra Gomez  Procedure(s) Performed: Procedure(s): COLONOSCOPY WITH PROPOFOL (N/A) ESOPHAGOGASTRODUODENOSCOPY (EGD) WITH PROPOFOL (N/A)  Patient Location: Endoscopy Unit  Anesthesia Type:General  Level of Consciousness: awake  Airway & Oxygen Therapy: Patient Spontanous Breathing  Post-op Assessment: Report given to RN  Post vital signs: Reviewed  Last Vitals:  Filed Vitals:   08/07/15 1034  BP: 130/83  Pulse: 99  Temp: 35.7 C  Resp: 15    Complications: No apparent anesthesia complications

## 2015-08-07 NOTE — Op Note (Signed)
[facility] Gastroenterology Patient Name: Cassandra Gomez Procedure Date: 08/07/2015 10:13 AM MRN: [id number] Account #: [id number] Date of Birth: 01/30/1936 Admit Type: Outpatient Age: 79 [facility] END[facility] ROOM 2 Gender: Female Note Status: Finalized Procedure:         Colonoscopy Indications:       Screening for colorectal malignant neoplasm, This is the                     patient's first colonoscopy Patient Profile:   This is a 79 year old female. Providers:         Rhona RaiderMatthew G. Shelle Ironein, MD Referring MD:      Serita ShellerErnest B. Maryellen PileEason, MD (Referring MD) Medicines:         Propofol per Anesthesia Complications:     No immediate complications. Procedure:         Pre-Anesthesia Assessment:                    - Prior to the procedure, a History and Physical was                     performed, and patient medications, allergies and                     sensitivities were reviewed. The patient's tolerance of                     previous anesthesia was reviewed.                    After obtaining informed consent, the colonoscope was                     passed under direct vision. Throughout the procedure, the                     patient's blood pressure, pulse, and oxygen saturations                     were monitored continuously. The Colonoscope was                     introduced through the anus and advanced to the the cecum,                     identified by appendiceal orifice and ileocecal valve. The                     colonoscopy was performed without difficulty. The patient                     tolerated the procedure well. The quality of the bowel                     preparation was excellent. Findings:      Many large-mouthed diverticula were found in the sigmoid colon.      Internal hemorrhoids were found during retroflexion. The hemorrhoids       were Grade I (internal hemorrhoids that do not prolapse).      The exam was otherwise without abnormality. Impression:         - Diverticulosis in the sigmoid colon.                    - Internal hemorrhoids.                    -  The examination was otherwise normal.                    - No specimens collected. Recommendation:    - Observe patient in GI recovery unit.                    - High fiber diet.                    - Continue present medications.                    - No further colonoscopies for colon cancer screening                     indicated given age.                    - The findings and recommendations were discussed with the                     patient.                    - The findings and recommendations were discussed with the                     patient's family. Procedure Code(s): --- Professional ---                    [phone], Colonoscopy, flexible; diagnostic, including                     collection of specimen(s) by brushing or washing, when                     performed (separate procedure) CPT copyright 2014 American Medical Association. All rights reserved. The codes documented in this report are preliminary and upon coder review may  be revised to meet current compliance requirements. Kathalene Frames, MD 08/07/2015 10:34:13 AM This report has been signed electronically. Number of Addenda: 0 Note Initiated On: 08/07/2015 10:13 AM Scope Withdrawal Time: 0 hours 9 minutes 48 seconds  Total Procedure Duration: 0 hours 14 minutes 11 seconds       [facility]

## 2015-08-07 NOTE — Anesthesia Postprocedure Evaluation (Signed)
  Anesthesia Post-op Note  Patient: Cassandra Gomez  Procedure(s) Performed: Procedure(s): COLONOSCOPY WITH PROPOFOL (N/A) ESOPHAGOGASTRODUODENOSCOPY (EGD) WITH PROPOFOL (N/A)  Anesthesia type:General  Patient location: PACU  Post pain: Pain level controlled  Post assessment: Post-op Vital signs reviewed, Patient's Cardiovascular Status Stable, Respiratory Function Stable, Patent Airway and No signs of Nausea or vomiting  Post vital signs: Reviewed and stable  Last Vitals:  Filed Vitals:   08/07/15 1055  BP: 141/89  Pulse: 100  Temp:   Resp: 20    Level of consciousness: awake, alert  and patient cooperative  Complications: No apparent anesthesia complications

## 2015-08-07 NOTE — Discharge Instructions (Signed)

## 2015-08-08 ENCOUNTER — Encounter: Payer: Self-pay | Admitting: Gastroenterology

## 2015-09-13 ENCOUNTER — Ambulatory Visit: Admit: 2015-09-13 | Payer: Medicare Other | Admitting: Gastroenterology

## 2015-09-13 SURGERY — MONITORING, ESOPHAGEAL PH, 24 HOUR

## 2015-09-26 DIAGNOSIS — R32 Unspecified urinary incontinence: Secondary | ICD-10-CM | POA: Insufficient documentation

## 2015-09-26 DIAGNOSIS — N8110 Cystocele, unspecified: Secondary | ICD-10-CM | POA: Insufficient documentation

## 2015-09-26 DIAGNOSIS — R053 Chronic cough: Secondary | ICD-10-CM | POA: Insufficient documentation

## 2015-09-26 DIAGNOSIS — R05 Cough: Secondary | ICD-10-CM | POA: Insufficient documentation

## 2016-01-02 ENCOUNTER — Ambulatory Visit: Payer: Medicare Other | Admitting: Obstetrics and Gynecology

## 2016-02-19 ENCOUNTER — Other Ambulatory Visit: Payer: Self-pay | Admitting: Internal Medicine

## 2016-02-19 DIAGNOSIS — Z1231 Encounter for screening mammogram for malignant neoplasm of breast: Secondary | ICD-10-CM

## 2016-03-28 ENCOUNTER — Other Ambulatory Visit: Payer: Self-pay | Admitting: Internal Medicine

## 2016-03-28 ENCOUNTER — Ambulatory Visit
Admission: RE | Admit: 2016-03-28 | Discharge: 2016-03-28 | Disposition: A | Discharge status: Home / Self Care | Payer: Medicare Other | Source: Ambulatory Visit | Attending: Internal Medicine | Admitting: Internal Medicine

## 2016-03-28 DIAGNOSIS — Z1231 Encounter for screening mammogram for malignant neoplasm of breast: Secondary | ICD-10-CM

## 2016-07-30 ENCOUNTER — Ambulatory Visit (INDEPENDENT_AMBULATORY_CARE_PROVIDER_SITE_OTHER): Payer: Medicare Other | Admitting: Urology

## 2016-07-30 ENCOUNTER — Encounter: Payer: Self-pay | Admitting: Urology

## 2016-07-30 VITALS — BP 108/65 | HR 74 | Ht 63.0 in | Wt 115.2 lb

## 2016-07-30 DIAGNOSIS — Z3009 Encounter for other general counseling and advice on contraception: Secondary | ICD-10-CM

## 2016-07-30 DIAGNOSIS — N3946 Mixed incontinence: Secondary | ICD-10-CM

## 2016-07-30 DIAGNOSIS — R351 Nocturia: Secondary | ICD-10-CM

## 2016-07-30 DIAGNOSIS — K469 Unspecified abdominal hernia without obstruction or gangrene: Secondary | ICD-10-CM | POA: Insufficient documentation

## 2016-07-30 DIAGNOSIS — E039 Hypothyroidism, unspecified: Secondary | ICD-10-CM | POA: Insufficient documentation

## 2016-07-30 LAB — BLADDER SCAN AMB NON-IMAGING: SCAN RESULT: 0

## 2016-07-30 MED ORDER — AQUORAL MT SOLN: 1.0000 | Freq: Four times a day (QID) | OROMUCOSAL | 12 refills | Status: DC | PRN

## 2016-07-30 MED ORDER — ESTROGENS, CONJUGATED 0.625 MG/GM VA CREA: 1.0000 | TOPICAL_CREAM | Freq: Every day | VAGINAL | 12 refills | Status: DC

## 2016-07-30 MED ORDER — ESTRADIOL 0.1 MG/GM VA CREA: TOPICAL_CREAM | VAGINAL | 12 refills | Status: AC

## 2016-07-30 NOTE — Progress Notes (Signed)
07/30/2016 2:11 PM   [name] Oct 01, 1935 [id number]  Referring provider: Derwood Kaplan, MD [address] [place], [place] [address]  Chief Complaint  Patient presents with  . New Patient (Initial Visit)    Urinary incontinence    HPI: Patient is a 80 -year-old Hispanic female who is referred to [place] by, Dr. Maryellen Pile, for urinary incontinence who presents with her daughter, [name], and interpreter, [name].   Patient states that she has had urinary incontinence for over a year.  She underwent vaginal sling in 11/2015 with Dr. Doy Hutching for SUI and she has recently developed nocturia and night time incontinence.    Patient has incontinence with walking and coughing.   The incontinence volume is minimal since the surgery.  She wears panty liners during the day.  She is experiencing no incontinent episodes during the night.  She is having associated urinary frequency, urgency and nocturia.  These symptoms occur at night with her having to get up every 15 minutes to urinate.    She does not have a history of urinary tract infections, STI's or injury to the bladder.   She denies dysuria, gross hematuria, suprapubic pain, back pain, abdominal pain or flank pain.  She has not had any recent fevers, chills, nausea or vomiting.   She does not have a history of nephrolithiasis or GU trauma.   She is not sexually active.  She is post menopausal.   She admits to constipation.   She is not having pain with bladder filling.    She has not had any recent imaging studies.    She is drinking one large glass of water during the night due to having a dry mouth.  She is drinking one to two caffeinated beverages daily.  She is not drinking alcohol beverages daily.    Her daughter also states that she snores during the night and she has witnessed apneic events.    PMH: Past Medical History:  Diagnosis Date  . Abdominal mass   . AR (allergic rhinitis)   . Asthma   . GERD (gastroesophageal reflux  disease)   . H. pylori infection    completed atb 07/03/2015  . Hernia, epigastric   . Hypercholesteremia   . Hypothyroidism   . Osteoporosis   . Thyroid disease    hypo  . Vaginal enterocele   . Vulvar leukoplakia     Surgical History: Past Surgical History:  Procedure Laterality Date  . ABDOMINAL HYSTERECTOMY    . BREAST EXCISIONAL BIOPSY Right 1970'S   negative   . COLONOSCOPY WITH PROPOFOL N/A 08/07/2015   Procedure: COLONOSCOPY WITH PROPOFOL;  Surgeon: Elnita Maxwell, MD;  Location: [facility] ENDOSCOPY;  Service: Endoscopy;  Laterality: N/A;  . ESOPHAGOGASTRODUODENOSCOPY (EGD) WITH PROPOFOL N/A 08/07/2015   Procedure: ESOPHAGOGASTRODUODENOSCOPY (EGD) WITH PROPOFOL;  Surgeon: Elnita Maxwell, MD;  Location[facility] ENDOSCOPY;  Service: Endoscopy;  Laterality: N/A;  . HERNIA REPAIR      Home Medications:    Medication List       Accurate as of 07/30/16  2:11 PM. Always use your most recent med list.          albuterol 108 (90 Base) MCG/ACT inhaler Commonly known as:  PROVENTIL HFA;VENTOLIN HFA Inhale 2 puffs into the lungs every 6 (six) hours as needed for wheezing or shortness of breath.   ANORO ELLIPTA 62.5-25 MCG/INH Aepb Generic drug:  umeclidinium-vilanterol Inhale into the lungs.   CLOBETASOL PROPIONATE E 0.05 % emollient cream Generic drug:  Clobetasol Prop Emollient Base   conjugated estrogens vaginal cream Commonly known as:  PREMARIN Place 1 Applicatorful vaginally daily. Apply 0.5mg  (pea-sized amount)  just inside the vaginal introitus with a finger-tip every night for two weeks and then Monday, Wednesday and Friday nights.   CRESTOR 10 MG tablet Generic drug:  rosuvastatin   DEXILANT 60 MG capsule Generic drug:  dexlansoprazole   estradiol 0.1 MG/GM vaginal cream Commonly known as:  ESTRACE VAGINAL Apply 0.5mg  (pea-sized amount)  just inside the vaginal introitus with a finger-tip every night for two weeks and then Monday, Wednesday and Friday  nights.   levothyroxine 125 MCG tablet Commonly known as:  SYNTHROID, LEVOTHROID 112 mcg.   pantoprazole 40 MG tablet Commonly known as:  PROTONIX Take 40 mg by mouth daily.       Allergies:  Allergies  Allergen Reactions  . Sulfa Antibiotics Anaphylaxis and Nausea And Vomiting    Family History: Family History  Problem Relation Age of Onset  . Breast cancer Sister [id number]    two sisters breast cancer  . Ovarian cancer Sister   . Tuberculosis Mother   . Diabetes Neg Hx   . Heart disease Neg Hx     Social History:  reports that she has never smoked. She has never used smokeless tobacco. She reports that she does not drink alcohol or use drugs.  ROS: UROLOGY Frequent Urination?: Yes Hard to postpone urination?: Yes Burning/pain with urination?: No Get up at night to urinate?: Yes Leakage of urine?: No Urine stream starts and stops?: No Trouble starting stream?: No Do you have to strain to urinate?: No Blood in urine?: No Urinary tract infection?: No Sexually transmitted disease?: No Injury to kidneys or bladder?: No Painful intercourse?: No Weak stream?: No Currently pregnant?: No Vaginal bleeding?: No Last menstrual period?: n  Gastrointestinal Nausea?: No Vomiting?: No Indigestion/heartburn?: Yes Diarrhea?: No Constipation?: Yes  Constitutional Fever: No Night sweats?: No Weight loss?: No Fatigue?: No  Skin Skin rash/lesions?: No Itching?: Yes  Eyes Blurred vision?: Yes Double vision?: No  Ears/Nose/Throat Sore throat?: No Sinus problems?: No  Hematologic/Lymphatic Swollen glands?: No Easy bruising?: No  Cardiovascular Leg swelling?: Yes Chest pain?: No  Respiratory Cough?: No Shortness of breath?: No  Endocrine Excessive thirst?: No  Musculoskeletal Back pain?: Yes Joint pain?: Yes  Neurological Headaches?: No Dizziness?: No  Psychologic Depression?: No Anxiety?: No  Physical Exam: BP 108/65   Pulse 74   Ht 5\' 3"   (1.6 m)   Wt 115 lb 3.2 oz (52.3 kg)   BMI 20.41 kg/m   Constitutional: Well nourished. Alert and oriented, No acute distress. HEENT: [place] AT, moist mucus membranes. Trachea midline, no masses. Cardiovascular: No clubbing, cyanosis, or edema. Respiratory: Normal respiratory effort, no increased work of breathing. GI: Abdomen is soft, non tender, non distended, no abdominal masses. Liver and spleen not palpable.  No hernias appreciated.  Stool sample for occult testing is not indicated.   GU: No CVA tenderness.  No bladder fullness or masses.  Atrophic external genitalia, normal pubic hair distribution, no lesions.  Normal urethral meatus, no lesions, no prolapse, no discharge.   No urethral masses, tenderness and/or tenderness. No bladder fullness, tenderness or masses. Pale vagina mucosa, poor estrogen effect, no discharge, no lesions, good pelvic support, no cystocele or rectocele noted. Shortened vaginal vault.   Cervix, uterus and adnexa are surgically absent.   Anus and perineum are without rashes or lesions.    Skin: No rashes, bruises or suspicious  lesions. Lymph: No cervical or inguinal adenopathy. Neurologic: Grossly intact, no focal deficits, moving all 4 extremities. Psychiatric: Normal mood and affect.  Laboratory Data: Lab Results  Component Value Date   WBC 5.2 04/08/2015   HGB 13.7 04/08/2015   HCT 40.8 04/08/2015   MCV 87.1 04/08/2015   PLT 285 04/08/2015    Lab Results  Component Value Date   CREATININE 0.53 04/08/2015    Lab Results  Component Value Date   AST 26 04/08/2015   Lab Results  Component Value Date   ALT 20 04/08/2015    Pertinent Imaging: Results for Ozella AlmondMORALES, Ari (MRN [id number]) as of 07/30/2016 14:06  Ref. Range 07/30/2016 13:38  Scan Result Unknown 0    Assessment & Plan:    1. Nocturia  -  I explained to the patient that nocturia is often multi-factorial and difficult to treat.  Sleeping disorders, heart conditions, peripheral  vascular disease, diabetes, an enlarged prostate for men, an urethral stricture causing bladder outlet obstruction and/or certain medications can contribute to nocturia.  - I have suggested that the patient avoid caffeine after noon and use Aquarol spray during the night for her dry mouth instead of drinking water.  He or she may also benefit from fluid restrictions after 6:00 in the evening and voiding just prior to bedtime.  - I have explained that research studies have showed that over 84% of patients with sleep apnea reported frequent nighttime urination.   With sleep apnea, oxygen decreases, carbon dioxide increases, the blood become more acidic, the heart rate drops and blood vessels in the lung constrict.  The body is then alerted that something is very wrong. The sleeper must wake enough to reopen the airway. By this time, the heart is racing and experiences a false signal of fluid overload. The heart excretes a hormone-like protein that tells the body to get rid of sodium and water, resulting in nocturia.  -  I also informed the patient that a recent study noted that decreasing sodium intake to 2.3 grams daily, if they don't have issues with hyponatremia, can also reduce the number of nightly voids  - The patient may benefit from a discussion with his or her primary care physician to see if he or she has risk factors for sleep apnea or other sleep disturbances and obtaining a sleep study.  2. Stress incontinence  - minimal bother  - [facility]  3. Vaginal atrophy  - patient had been on vaginal estrogen cream prior to her surgery  - restart the vaginal estrogen cream, samples given of Estrace and Premarin  - I have also given prescriptions for the Estrace cream and Premarin cream, so that the patient may carry them to the pharmacy to see which one of the branded creams would be most economical for her.  If she finds both medications cost prohibitive, she is instructed to  call the office.  We can then call in a compounded vaginal estrogen cream for the patient that may be more affordable.    She will follow up in three months for an exam.     Return in about 3 months (around 10/30/2016) for exam.  These notes generated with voice recognition software. I apologize for typographical errors.  Michiel CowboySHANNON Lavita Pontius, PA-C  [facility] [address], [address] [place], [place] [address] [phone]

## 2016-09-16 ENCOUNTER — Ambulatory Visit: Payer: Medicare Other | Attending: Otolaryngology

## 2016-09-16 DIAGNOSIS — G4733 Obstructive sleep apnea (adult) (pediatric): Secondary | ICD-10-CM | POA: Insufficient documentation

## 2016-10-29 NOTE — Progress Notes (Signed)
10/30/2016 10:12 AM   Cassandra Gomez 1936-06-26 [id number]  Referring provider: Derwood Kaplan, MD [address] [place], [place] [address]  Chief Complaint  Patient presents with  . Urinary Incontinence    Mixed  3 month follow up   . Nocturia    HPI: Patient is a 81 year old Hispanic female who presents today for 3 month follow-up with interpreter, [name], for nocturia, SUI and vaginal atrophy.  Background history Patient is a 22 -year-old Hispanic female who is referred to [place] by, Dr. Maryellen Pile, for urinary incontinence who presents with her daughter, [name], and interpreter, [name].  Patient states that she has had urinary incontinence for over a year.  She underwent vaginal sling in 11/2015 with Dr. Doy Hutching for SUI and she has recently developed nocturia and night time incontinence.  Patient has incontinence with walking and coughing.   The incontinence volume is minimal since the surgery.  She wears panty liners during the day.  She is experiencing no incontinent episodes during the night.  She is having associated urinary frequency, urgency and nocturia.  These symptoms occur at night with her having to get up every 15 minutes to urinate.  She does not have a history of urinary tract infections, STI's or injury to the bladder.  She denies dysuria, gross hematuria, suprapubic pain, back pain, abdominal pain or flank pain.  She has not had any recent fevers, chills, nausea or vomiting.   She does not have a history of nephrolithiasis or GU trauma.   She is not sexually active.  She is post menopausal.   She admits to constipation.   She is not having pain with bladder filling.  She has not had any recent imaging studies.  She is drinking one large glass of water during the night due to having a dry mouth.  She is drinking one to two caffeinated beverages daily.  She is not drinking alcohol beverages daily.  Her daughter also states that she snores during the night and she has witnessed apneic  events.    Nocturia These symptoms occur at night with her having to get up every 15 minutes to urinate.  Patient's daughter has witnessed apneic events.   She did undergo a sleep study in 08/2016.  She has not received her CPAP machine.  We will send a message to Dr. Jake Church office.   SUI She underwent vaginal sling in 11/2015 with Dr. Doy Hutching for SUI and had recently developed nocturia and night time incontinence.  Patient has incontinence with walking and coughing.   The incontinence volume is minimal since the surgery.  She wears panty liners during the day.  Today, she is complaining of frequency x 7, urgency x 3, nocturia x 8 and intermittency.  She has had 3 incontinence episodes nightly.    Vaginal atrophy Patient only used the cream for one week.    PMH: Past Medical History:  Diagnosis Date  . Abdominal mass   . AR (allergic rhinitis)   . Asthma   . GERD (gastroesophageal reflux disease)   . H. pylori infection    completed atb 07/03/2015  . Hernia, epigastric   . Hypercholesteremia   . Hypothyroidism   . Osteoporosis   . Thyroid disease    hypo  . Vaginal enterocele   . Vulvar leukoplakia     Surgical History: Past Surgical History:  Procedure Laterality Date  . ABDOMINAL HYSTERECTOMY    . BREAST EXCISIONAL BIOPSY Right 1970'S   negative   .  COLONOSCOPY WITH PROPOFOL N/A 08/07/2015   Procedure: COLONOSCOPY WITH PROPOFOL;  Surgeon: Elnita MaxwellMatthew Gordon Rein, MD;  [facility];  Service: Endoscopy;  Laterality: N/A;  . ESOPHAGOGASTRODUODENOSCOPY (EGD) WITH PROPOFOL N/A 08/07/2015   Procedure: ESOPHAGOGASTRODUODENOSCOPY (EGD) WITH PROPOFOL;  Surgeon: Elnita MaxwellMatthew Gordon Rein, MD;  [facility];  Service: Endoscopy;  Laterality: N/A;  . HERNIA REPAIR      Home Medications:  Allergies as of 10/30/2016      Reactions   Shellfish Allergy Anaphylaxis   seafood   Sulfa Antibiotics Anaphylaxis, Nausea And Vomiting      Medication List       Accurate as  of 10/30/16 10:12 AM. Always use your most recent med list.          albuterol 108 (90 Base) MCG/ACT inhaler Commonly known as:  PROVENTIL HFA;VENTOLIN HFA Inhale 2 puffs into the lungs every 6 (six) hours as needed for wheezing or shortness of breath.   ANORO ELLIPTA 62.5-25 MCG/INH Aepb Generic drug:  umeclidinium-vilanterol Inhale into the lungs.   AQUORAL Soln Use as directed 1 Act in the mouth or throat 4 (four) times daily as needed.   CLOBETASOL PROPIONATE E 0.05 % emollient cream Generic drug:  Clobetasol Prop Emollient Base   conjugated estrogens vaginal cream Commonly known as:  PREMARIN Place 1 Applicatorful vaginally daily. Apply 0.5mg  (pea-sized amount)  just inside the vaginal introitus with a finger-tip every night for two weeks and then Monday, Wednesday and Friday nights.   CRESTOR 10 MG tablet Generic drug:  rosuvastatin   DEXILANT 60 MG capsule Generic drug:  dexlansoprazole   estradiol 0.1 MG/GM vaginal cream Commonly known as:  ESTRACE VAGINAL Apply 0.5mg  (pea-sized amount)  just inside the vaginal introitus with a finger-tip every night for two weeks and then Monday, Wednesday and Friday nights.   levothyroxine 125 MCG tablet Commonly known as:  SYNTHROID, LEVOTHROID 112 mcg.   pantoprazole 40 MG tablet Commonly known as:  PROTONIX Take 40 mg by mouth daily.       Allergies:  Allergies  Allergen Reactions  . Shellfish Allergy Anaphylaxis    seafood  . Sulfa Antibiotics Anaphylaxis and Nausea And Vomiting    Family History: Family History  Problem Relation Age of Onset  . Breast cancer Sister 5985    two sisters breast cancer  . Ovarian cancer Sister   . Tuberculosis Mother   . Diabetes Neg Hx   . Heart disease Neg Hx   . Prostate cancer Neg Hx   . Kidney disease Neg Hx   . Kidney cancer Neg Hx     Social History:  reports that she has never smoked. She has never used smokeless tobacco. She reports that she does not drink alcohol or  use drugs.  ROS: UROLOGY Frequent Urination?: Yes Hard to postpone urination?: Yes Burning/pain with urination?: No Get up at night to urinate?: Yes Leakage of urine?: No Urine stream starts and stops?: Yes Trouble starting stream?: No Do you have to strain to urinate?: No Blood in urine?: No Urinary tract infection?: No Sexually transmitted disease?: No Injury to kidneys or bladder?: No Painful intercourse?: No Weak stream?: No Currently pregnant?: No Vaginal bleeding?: No Last menstrual period?: n  Gastrointestinal Nausea?: No Vomiting?: No Indigestion/heartburn?: Yes Diarrhea?: No Constipation?: Yes  Constitutional Fever: No Night sweats?: No Weight loss?: No Fatigue?: Yes  Skin Skin rash/lesions?: Yes Itching?: Yes  Eyes Blurred vision?: No Double vision?: Yes  Ears/Nose/Throat Sore throat?: No Sinus problems?: No  Hematologic/Lymphatic Swollen glands?: No Easy bruising?: Yes  Cardiovascular Leg swelling?: No Chest pain?: No  Respiratory Cough?: No Shortness of breath?: No  Endocrine Excessive thirst?: Yes  Musculoskeletal Back pain?: Yes Joint pain?: No  Neurological Headaches?: No Dizziness?: No  Psychologic Depression?: No Anxiety?: Yes  Physical Exam: BP (!) 149/79   Pulse 80   Ht 4\' 2"  (1.27 m)   Wt 118 lb 6.4 oz (53.7 kg)   BMI 33.30 kg/m   Constitutional: Well nourished. Alert and oriented, No acute distress. HEENT: [place] AT, moist mucus membranes. Trachea midline, no masses. Cardiovascular: No clubbing, cyanosis, or edema. Respiratory: Normal respiratory effort, no increased work of breathing. Skin: No rashes, bruises or suspicious lesions. Lymph: No cervical or inguinal adenopathy. Neurologic: Grossly intact, no focal deficits, moving all 4 extremities. Psychiatric: Normal mood and affect.  Laboratory Data: Lab Results  Component Value Date   WBC 5.2 04/08/2015   HGB 13.7 04/08/2015   HCT 40.8 04/08/2015   MCV  87.1 04/08/2015   PLT 285 04/08/2015    Lab Results  Component Value Date   CREATININE 0.53 04/08/2015    Lab Results  Component Value Date   AST 26 04/08/2015   Lab Results  Component Value Date   ALT 20 04/08/2015     Assessment & Plan:    1. Nocturia  -  Sleep study completed  - Needs CPAP machine, will contact PCP  2. Stress incontinence  - minimal bother  - s/p sling procedure 11/2015 at [facility]  3. Vaginal atrophy  - patient had been on vaginal estrogen cream prior to her surgery  - explained to the patient that this medication is long term  - she is given samples and will apply the cream three nights weekly  She will follow up in three months for an exam.     Return in about 3 months (around 01/27/2017) for PVR, exam and OAB questionnaire.  These notes generated with voice recognition software. I apologize for typographical errors.  Michiel Cowboy, PA-C  [facility] [address], [address] [place], [place] [address] [phone]

## 2016-10-30 ENCOUNTER — Ambulatory Visit (INDEPENDENT_AMBULATORY_CARE_PROVIDER_SITE_OTHER): Payer: Medicare Other | Admitting: Urology

## 2016-10-30 ENCOUNTER — Encounter: Payer: Self-pay | Admitting: Urology

## 2016-10-30 ENCOUNTER — Telehealth: Payer: Self-pay | Admitting: Urology

## 2016-10-30 VITALS — BP 149/79 | HR 80 | Ht <= 58 in | Wt 118.4 lb

## 2016-10-30 DIAGNOSIS — R351 Nocturia: Secondary | ICD-10-CM

## 2016-10-30 DIAGNOSIS — N393 Stress incontinence (female) (male): Secondary | ICD-10-CM

## 2016-10-30 DIAGNOSIS — R350 Frequency of micturition: Secondary | ICD-10-CM | POA: Diagnosis not present

## 2016-10-30 DIAGNOSIS — N952 Postmenopausal atrophic vaginitis: Secondary | ICD-10-CM

## 2016-10-30 LAB — BLADDER SCAN AMB NON-IMAGING: SCAN RESULT: 0

## 2016-10-30 NOTE — Telephone Encounter (Signed)
Spoke with Dr. Jake ChurchEason's office who stated that pt has had sleep study done and comes to see Dr. Maryellen PileEason early next week for the next step. Dr. Jake ChurchEason's office will fax over OV notes after OV next week.

## 2016-10-30 NOTE — Telephone Encounter (Signed)
Would you call Dr. Jake ChurchEason's nurse and ask them about the patient's sleep study and getting her a CPAP machine as she is getting up frequently during the night to urinate?

## 2017-01-24 NOTE — Progress Notes (Deleted)
01/27/2017 6:31 PM   Cassandra Gomez 1936-06-26 [id number]  Referring provider: Derwood Kaplan, MD [address] [place], [place] [address]  No chief complaint on file.   HPI: Patient is a 81 year old Hispanic female who presents today for 3 month follow-up with interpreter, ***, for nocturia, SUI and vaginal atrophy.  Nocturia These symptoms occur at night with her having to get up every 15 minutes to urinate.  Patient's daughter has witnessed apneic events.  She did undergo a sleep study in 08/2016.  She has not received her CPAP machine.  We will send a message to Dr. Jake Church office.   SUI She underwent vaginal sling in 11/2015 with Dr. Doy Hutching for SUI and had recently developed nocturia and night time incontinence.  Patient has incontinence with walking and coughing.   The incontinence volume is minimal since the surgery.  She wears panty liners during the day.  Today, she is complaining of frequency x 7, urgency x 3, nocturia x 8 and intermittency.  She has had 3 incontinence episodes nightly.    Vaginal atrophy Patient only used the cream for one week.    PMH: Past Medical History:  Diagnosis Date  . Abdominal mass   . AR (allergic rhinitis)   . Asthma   . GERD (gastroesophageal reflux disease)   . H. pylori infection    completed atb 07/03/2015  . Hernia, epigastric   . Hypercholesteremia   . Hypothyroidism   . Osteoporosis   . Thyroid disease    hypo  . Vaginal enterocele   . Vulvar leukoplakia     Surgical History: Past Surgical History:  Procedure Laterality Date  . ABDOMINAL HYSTERECTOMY    . BREAST EXCISIONAL BIOPSY Right 1970'S   negative   . COLONOSCOPY WITH PROPOFOL N/A 08/07/2015   Procedure: COLONOSCOPY WITH PROPOFOL;  Surgeon: Elnita Maxwell, MD;  Location: [facility] ENDOSCOPY;  Service: Endoscopy;  Laterality: N/A;  . ESOPHAGOGASTRODUODENOSCOPY (EGD) WITH PROPOFOL N/A 08/07/2015   Procedure: ESOPHAGOGASTRODUODENOSCOPY (EGD) WITH PROPOFOL;  Surgeon:  Elnita Maxwell, MD;  [facility];  Service: Endoscopy;  Laterality: N/A;  . HERNIA REPAIR      Home Medications:  Allergies as of 01/27/2017      Reactions   Shellfish Allergy Anaphylaxis   seafood   Sulfa Antibiotics Anaphylaxis, Nausea And Vomiting      Medication List       Accurate as of 01/24/17  6:31 PM. Always use your most recent med list.          albuterol 108 (90 Base) MCG/ACT inhaler Commonly known as:  PROVENTIL HFA;VENTOLIN HFA Inhale 2 puffs into the lungs every 6 (six) hours as needed for wheezing or shortness of breath.   ANORO ELLIPTA 62.5-25 MCG/INH Aepb Generic drug:  umeclidinium-vilanterol Inhale into the lungs.   AQUORAL Soln Use as directed 1 Act in the mouth or throat 4 (four) times daily as needed.   CLOBETASOL PROPIONATE E 0.05 % emollient cream Generic drug:  Clobetasol Prop Emollient Base   conjugated estrogens vaginal cream Commonly known as:  PREMARIN Place 1 Applicatorful vaginally daily. Apply 0.5mg  (pea-sized amount)  just inside the vaginal introitus with a finger-tip every night for two weeks and then Monday, Wednesday and Friday nights.   CRESTOR 10 MG tablet Generic drug:  rosuvastatin   DEXILANT 60 MG capsule Generic drug:  dexlansoprazole   estradiol 0.1 MG/GM vaginal cream Commonly known as:  ESTRACE VAGINAL Apply 0.5mg  (pea-sized amount)  just inside the  vaginal introitus with a finger-tip every night for two weeks and then Monday, Wednesday and Friday nights.   levothyroxine 125 MCG tablet Commonly known as:  SYNTHROID, LEVOTHROID 112 mcg.   pantoprazole 40 MG tablet Commonly known as:  PROTONIX Take 40 mg by mouth daily.       Allergies:  Allergies  Allergen Reactions  . Shellfish Allergy Anaphylaxis    seafood  . Sulfa Antibiotics Anaphylaxis and Nausea And Vomiting    Family History: Family History  Problem Relation Age of Onset  . Breast cancer Sister 74    two sisters breast cancer    . Ovarian cancer Sister   . Tuberculosis Mother   . Diabetes Neg Hx   . Heart disease Neg Hx   . Prostate cancer Neg Hx   . Kidney disease Neg Hx   . Kidney cancer Neg Hx     Social History:  reports that she has never smoked. She has never used smokeless tobacco. She reports that she does not drink alcohol or use drugs.  ROS:                                        Physical Exam: There were no vitals taken for this visit.  Constitutional: Well nourished. Alert and oriented, No acute distress. HEENT: [place] AT, moist mucus membranes. Trachea midline, no masses. Cardiovascular: No clubbing, cyanosis, or edema. Respiratory: Normal respiratory effort, no increased work of breathing. Skin: No rashes, bruises or suspicious lesions. Lymph: No cervical or inguinal adenopathy. Neurologic: Grossly intact, no focal deficits, moving all 4 extremities. Psychiatric: Normal mood and affect.  Laboratory Data: Lab Results  Component Value Date   WBC 5.2 04/08/2015   HGB 13.7 04/08/2015   HCT 40.8 04/08/2015   MCV 87.1 04/08/2015   PLT 285 04/08/2015    Lab Results  Component Value Date   CREATININE 0.53 04/08/2015    Lab Results  Component Value Date   AST 26 04/08/2015   Lab Results  Component Value Date   ALT 20 04/08/2015     Assessment & Plan:    1. Nocturia  -  Sleep study completed  - Needs CPAP machine, will contact PCP  2. Stress incontinence  - minimal bother  - s/p sling procedure 11/2015 at [facility]  3. Vaginal atrophy  - patient had been on vaginal estrogen cream prior to her surgery  - explained to the patient that this medication is long term  - she is given samples and will apply the cream three nights weekly  She will follow up in three months for an exam.     No Follow-up on file.  These notes generated with voice recognition software. I apologize for typographical errors.  Michiel Cowboy, PA-C  [facility] [address] [place], [place] [address] [phone]

## 2017-01-27 ENCOUNTER — Encounter: Payer: Self-pay | Admitting: Urology

## 2017-01-27 ENCOUNTER — Ambulatory Visit: Payer: Medicare Other | Admitting: Urology

## 2017-03-06 ENCOUNTER — Other Ambulatory Visit: Payer: Self-pay | Admitting: Internal Medicine

## 2017-03-06 DIAGNOSIS — Z1231 Encounter for screening mammogram for malignant neoplasm of breast: Secondary | ICD-10-CM

## 2017-03-17 NOTE — Progress Notes (Signed)
03/18/2017 12:11 PM   [name] Nov 12, 1935 [id number]  Referring provider: Derwood Kaplan, MD [address] [place], [place] [address]  No chief complaint on file.   HPI: Patient is a 81 year old Hispanic female who presents today for 3 month follow-up with interpreter, [name], for nocturia, SUI and vaginal atrophy.  Background history Patient is a 26 -year-old Hispanic female who is referred to [place] by, Dr. Maryellen Pile, for urinary incontinence who presents with her daughter, [name], and interpreter, [name].  Patient states that she has had urinary incontinence for over a year.  She underwent vaginal sling in 11/2015 with Dr. Doy Hutching for SUI and she has recently developed nocturia and night time incontinence.  Patient has incontinence with walking and coughing.   The incontinence volume is minimal since the surgery.  She wears panty liners during the day.  She is experiencing no incontinent episodes during the night.  She is having associated urinary frequency, urgency and nocturia.  These symptoms occur at night with her having to get up every 15 minutes to urinate.  She does not have a history of urinary tract infections, STI's or injury to the bladder.  She denies dysuria, gross hematuria, suprapubic pain, back pain, abdominal pain or flank pain.  She has not had any recent fevers, chills, nausea or vomiting.   She does not have a history of nephrolithiasis or GU trauma.   She is not sexually active.  She is post menopausal.   She admits to constipation.   She is not having pain with bladder filling.  She has not had any recent imaging studies.  She is drinking one large glass of water during the night due to having a dry mouth.  She is drinking one to two caffeinated beverages daily.  She is not drinking alcohol beverages daily.  Her daughter also states that she snores during the night and she has witnessed apneic events.    Nocturia Patient is having nocturia 3-4 times nightly. She states it  is not bothersome to her as she constantly drinks fluid through the night. Patient's daughter has witnessed apneic events.  She did undergo a sleep study in 08/2016 and was found to have sleep apnea.  She did not acquire the CPAP machine as she states she will not sleep with it.  SUI She underwent vaginal sling in 11/2015 with Dr. Doy Hutching for SUI and had recently developed nocturia and night time incontinence.  Patient has incontinence with walking and coughing.   The incontinence volume is minimal since the surgery.  She wears panty liners during the day.  The patient has been experiencing urgency x 0-3 (stable), frequency x 4-7 (stable), not restricting fluids to avoid visits to the restroom, is engaging in toilet mapping, incontinence x 0-3 (stable) and nocturia x 3-4 (improved).  Her PVR is 0 mL.    Vaginal atrophy Patient is not using the vaginal estrogen cream at this time.  Her complaint today has to do with the feeling of having something in her rectum and the inability to feel clean after having a bowel movement.  PMH: Past Medical History:  Diagnosis Date  . Abdominal mass   . AR (allergic rhinitis)   . Asthma   . GERD (gastroesophageal reflux disease)   . H. pylori infection    completed atb 07/03/2015  . Hernia, epigastric   . Hypercholesteremia   . Hypothyroidism   . Osteoporosis   . Thyroid disease    hypo  . Vaginal  enterocele   . Vulvar leukoplakia     Surgical History: Past Surgical History:  Procedure Laterality Date  . ABDOMINAL HYSTERECTOMY    . BREAST EXCISIONAL BIOPSY Right 1970'S   negative   . COLONOSCOPY WITH PROPOFOL N/A 08/07/2015   Procedure: COLONOSCOPY WITH PROPOFOL;  Surgeon: Elnita Maxwell, MD;  Location[facility] ENDOSCOPY;  Service: Endoscopy;  Laterality: N/A;  . ESOPHAGOGASTRODUODENOSCOPY (EGD) WITH PROPOFOL N/A 08/07/2015   Procedure: ESOPHAGOGASTRODUODENOSCOPY (EGD) WITH PROPOFOL;  Surgeon: Elnita Maxwell, MD;  Location: [facility] ENDOSCOPY;   Service: Endoscopy;  Laterality: N/A;  . HERNIA REPAIR      Home Medications:  Allergies as of 03/18/2017      Reactions   Shellfish Allergy Anaphylaxis   seafood   Sulfa Antibiotics Anaphylaxis, Nausea And Vomiting      Medication List       Accurate as of 03/18/17 12:11 PM. Always use your most recent med list.          albuterol 108 (90 Base) MCG/ACT inhaler Commonly known as:  PROVENTIL HFA;VENTOLIN HFA Inhale 2 puffs into the lungs every 6 (six) hours as needed for wheezing or shortness of breath.   ANORO ELLIPTA 62.5-25 MCG/INH Aepb Generic drug:  umeclidinium-vilanterol Inhale into the lungs.   CRESTOR 10 MG tablet Generic drug:  rosuvastatin   DEXILANT 60 MG capsule Generic drug:  dexlansoprazole   estradiol 0.1 MG/GM vaginal cream Commonly known as:  ESTRACE VAGINAL Apply 0.5mg  (pea-sized amount)  just inside the vaginal introitus with a finger-tip every night for two weeks and then Monday, Wednesday and Friday nights.   levothyroxine 125 MCG tablet Commonly known as:  SYNTHROID, LEVOTHROID 112 mcg.   pantoprazole 40 MG tablet Commonly known as:  PROTONIX Take 40 mg by mouth daily.       Allergies:  Allergies  Allergen Reactions  . Shellfish Allergy Anaphylaxis    seafood  . Sulfa Antibiotics Anaphylaxis and Nausea And Vomiting    Family History: Family History  Problem Relation Age of Onset  . Breast cancer Sister 70       two sisters breast cancer  . Ovarian cancer Sister   . Tuberculosis Mother   . Diabetes Neg Hx   . Heart disease Neg Hx   . Prostate cancer Neg Hx   . Kidney disease Neg Hx   . Kidney cancer Neg Hx     Social History:  reports that she has never smoked. She has never used smokeless tobacco. She reports that she does not drink alcohol or use drugs.  ROS: UROLOGY Frequent Urination?: No Hard to postpone urination?: No Burning/pain with urination?: No Get up at night to urinate?: Yes Leakage of urine?: No Urine  stream starts and stops?: No Trouble starting stream?: No Do you have to strain to urinate?: No Blood in urine?: No Urinary tract infection?: No Sexually transmitted disease?: No Injury to kidneys or bladder?: No Painful intercourse?: No Weak stream?: No Currently pregnant?: No Vaginal bleeding?: No Last menstrual period?: n  Gastrointestinal Nausea?: No Vomiting?: No Indigestion/heartburn?: No Diarrhea?: No Constipation?: No  Constitutional Fever: No Night sweats?: No Weight loss?: No Fatigue?: No  Skin Skin rash/lesions?: No Itching?: No  Eyes Blurred vision?: Yes Double vision?: No  Ears/Nose/Throat Sore throat?: No Sinus problems?: No  Hematologic/Lymphatic Swollen glands?: No Easy bruising?: No  Cardiovascular Leg swelling?: No Chest pain?: No  Respiratory Cough?: No Shortness of breath?: No  Endocrine Excessive thirst?: No  Musculoskeletal Back pain?: Yes Joint pain?:  Yes  Neurological Headaches?: No Dizziness?: No  Psychologic Depression?: No Anxiety?: No  Physical Exam: BP 118/62   Pulse 78   Ht 4\' 2"  (1.27 m)   Wt 120 lb (54.4 kg)   BMI 33.75 kg/m   Constitutional: Well nourished. Alert and oriented, No acute distress. HEENT: [place] AT, moist mucus membranes. Trachea midline, no masses. Cardiovascular: No clubbing, cyanosis, or edema. Respiratory: Normal respiratory effort, no increased work of breathing. GI: Abdomen is soft, non tender, non distended, no abdominal masses. Liver and spleen not palpable.  No hernias appreciated.  Stool sample for occult testing is not indicated.   GU: No CVA tenderness.  No bladder fullness or masses.  Atrophic external genitalia, normal pubic hair distribution, no lesions.  Normal urethral meatus, no lesions, no prolapse, no discharge.   No urethral masses, tenderness and/or tenderness. No bladder fullness, tenderness or masses. Pale vagina mucosa, poor estrogen effect, no discharge, no lesions, good  pelvic support, no cystocele or rectocele noted. Shortened vaginal vault.   Cervix, uterus and adnexa are surgically absent.   Anus and perineum are without rashes.  External hemorrhoids present. Skin: No rashes, bruises or suspicious lesions. Lymph: No cervical or inguinal adenopathy. Neurologic: Grossly intact, no focal deficits, moving all 4 extremities. Psychiatric: Normal mood and affect.  Laboratory Data: Lab Results  Component Value Date   WBC 5.2 04/08/2015   HGB 13.7 04/08/2015   HCT 40.8 04/08/2015   MCV 87.1 04/08/2015   PLT 285 04/08/2015    Lab Results  Component Value Date   CREATININE 0.53 04/08/2015    Lab Results  Component Value Date   AST 26 04/08/2015   Lab Results  Component Value Date   ALT 20 04/08/2015     Assessment & Plan:    1. Nocturia  -  Has untreated sleep apnea  - Not bothersome to patient  2. Stress incontinence  - minimal bother  - [facility]  3. Vaginal atrophy  - Not bothersome to the patient  4. Hemorrhoids  - Reassured the patient  - Discussed conservative treatment measures such as hemorrhoidal cream and avoiding constipation  Return if symptoms worsen or fail to improve.  These notes generated with voice recognition software. I apologize for typographical errors.  Michiel CowboySHANNON Eliyohu Class, PA-C  [facility] [address], [address] [place], [place] [id number] [phone]

## 2017-03-18 ENCOUNTER — Ambulatory Visit (INDEPENDENT_AMBULATORY_CARE_PROVIDER_SITE_OTHER): Payer: Medicare Other | Admitting: Urology

## 2017-03-18 ENCOUNTER — Encounter: Payer: Self-pay | Admitting: Urology

## 2017-03-18 VITALS — BP 118/62 | HR 78 | Ht <= 58 in | Wt 120.0 lb

## 2017-03-18 DIAGNOSIS — N3946 Mixed incontinence: Secondary | ICD-10-CM

## 2017-03-18 DIAGNOSIS — K644 Residual hemorrhoidal skin tags: Secondary | ICD-10-CM | POA: Diagnosis not present

## 2017-03-18 DIAGNOSIS — N952 Postmenopausal atrophic vaginitis: Secondary | ICD-10-CM

## 2017-03-18 DIAGNOSIS — R351 Nocturia: Secondary | ICD-10-CM | POA: Diagnosis not present

## 2017-03-18 DIAGNOSIS — R32 Unspecified urinary incontinence: Secondary | ICD-10-CM | POA: Diagnosis not present

## 2017-03-18 LAB — BLADDER SCAN AMB NON-IMAGING

## 2017-04-18 ENCOUNTER — Ambulatory Visit
Admission: RE | Admit: 2017-04-18 | Discharge: 2017-04-18 | Disposition: A | Discharge status: Home / Self Care | Payer: Medicare Other | Source: Ambulatory Visit | Attending: Internal Medicine | Admitting: Internal Medicine

## 2017-04-18 DIAGNOSIS — Z1231 Encounter for screening mammogram for malignant neoplasm of breast: Secondary | ICD-10-CM | POA: Insufficient documentation

## 2017-07-22 ENCOUNTER — Ambulatory Visit: Payer: Medicare Other | Attending: Specialist

## 2017-07-22 DIAGNOSIS — G4733 Obstructive sleep apnea (adult) (pediatric): Secondary | ICD-10-CM | POA: Diagnosis not present

## 2018-01-19 ENCOUNTER — Other Ambulatory Visit: Payer: Self-pay | Admitting: Student

## 2018-01-19 DIAGNOSIS — R1084 Generalized abdominal pain: Secondary | ICD-10-CM

## 2018-01-19 DIAGNOSIS — R140 Abdominal distension (gaseous): Secondary | ICD-10-CM

## 2018-01-30 ENCOUNTER — Ambulatory Visit
Admission: RE | Admit: 2018-01-30 | Discharge: 2018-01-30 | Disposition: A | Discharge status: Home / Self Care | Payer: Medicare Other | Source: Ambulatory Visit | Attending: Student | Admitting: Student

## 2018-01-30 DIAGNOSIS — K5730 Diverticulosis of large intestine without perforation or abscess without bleeding: Secondary | ICD-10-CM | POA: Insufficient documentation

## 2018-01-30 DIAGNOSIS — R1084 Generalized abdominal pain: Secondary | ICD-10-CM | POA: Insufficient documentation

## 2018-01-30 DIAGNOSIS — R140 Abdominal distension (gaseous): Secondary | ICD-10-CM | POA: Diagnosis not present

## 2018-01-30 DIAGNOSIS — K449 Diaphragmatic hernia without obstruction or gangrene: Secondary | ICD-10-CM | POA: Diagnosis not present

## 2018-01-30 DIAGNOSIS — K439 Ventral hernia without obstruction or gangrene: Secondary | ICD-10-CM | POA: Insufficient documentation

## 2018-01-30 DIAGNOSIS — M4186 Other forms of scoliosis, lumbar region: Secondary | ICD-10-CM | POA: Insufficient documentation

## 2018-01-30 DIAGNOSIS — I700 Atherosclerosis of aorta: Secondary | ICD-10-CM | POA: Diagnosis not present

## 2018-01-30 LAB — POCT I-STAT CREATININE: Creatinine, Ser: 0.6 mg/dL (ref 0.44–1.00)

## 2018-01-30 MED ORDER — IOPAMIDOL (ISOVUE-370) INJECTION 76%
75.0000 mL | Freq: Once | INTRAVENOUS | Status: AC | PRN
  Administered 2018-01-30: 75 mL via INTRAVENOUS

## 2018-03-31 ENCOUNTER — Other Ambulatory Visit: Payer: Self-pay | Admitting: Internal Medicine

## 2018-03-31 DIAGNOSIS — Z1231 Encounter for screening mammogram for malignant neoplasm of breast: Secondary | ICD-10-CM

## 2018-04-20 ENCOUNTER — Ambulatory Visit
Admission: RE | Admit: 2018-04-20 | Discharge: 2018-04-20 | Disposition: A | Discharge status: Home / Self Care | Payer: Medicare Other | Source: Ambulatory Visit | Attending: Internal Medicine | Admitting: Internal Medicine

## 2018-04-20 DIAGNOSIS — Z1231 Encounter for screening mammogram for malignant neoplasm of breast: Secondary | ICD-10-CM | POA: Insufficient documentation

## 2019-06-10 ENCOUNTER — Other Ambulatory Visit: Payer: Self-pay | Admitting: Internal Medicine

## 2019-06-10 DIAGNOSIS — Z1231 Encounter for screening mammogram for malignant neoplasm of breast: Secondary | ICD-10-CM

## 2019-07-15 ENCOUNTER — Ambulatory Visit
Admission: RE | Admit: 2019-07-15 | Discharge: 2019-07-15 | Disposition: A | Discharge status: Home / Self Care | Payer: Medicare Other | Source: Ambulatory Visit | Attending: Internal Medicine | Admitting: Internal Medicine

## 2019-07-15 DIAGNOSIS — Z1231 Encounter for screening mammogram for malignant neoplasm of breast: Secondary | ICD-10-CM | POA: Insufficient documentation

## 2020-06-28 ENCOUNTER — Other Ambulatory Visit: Payer: Self-pay | Admitting: Internal Medicine

## 2020-06-28 DIAGNOSIS — Z1231 Encounter for screening mammogram for malignant neoplasm of breast: Secondary | ICD-10-CM

## 2020-07-21 ENCOUNTER — Other Ambulatory Visit: Payer: Self-pay

## 2020-07-21 ENCOUNTER — Ambulatory Visit
Admission: RE | Admit: 2020-07-21 | Discharge: 2020-07-21 | Disposition: A | Discharge status: Home / Self Care | Payer: Medicare Other | Source: Ambulatory Visit | Attending: Internal Medicine | Admitting: Internal Medicine

## 2020-07-21 DIAGNOSIS — Z1231 Encounter for screening mammogram for malignant neoplasm of breast: Secondary | ICD-10-CM | POA: Diagnosis not present

## 2021-05-29 IMAGING — MG DIGITAL SCREENING BILAT W/ TOMO W/ CAD
6 of 12 series · 6 of 36 positions shown · non-contrast
Comparison: Previous exam(s).

CLINICAL DATA: Screening.

EXAM:
DIGITAL SCREENING BILATERAL MAMMOGRAM WITH TOMO AND CAD

[L MLO synth-2D]
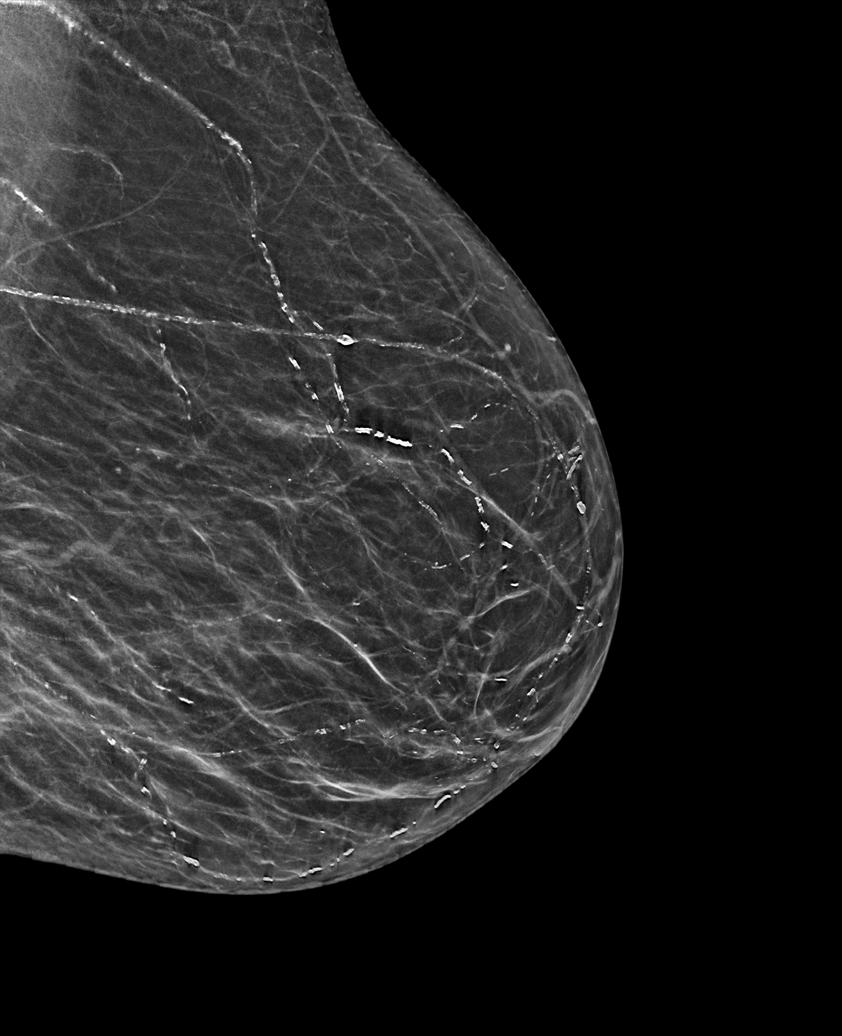

[L XCCL synth-2D]
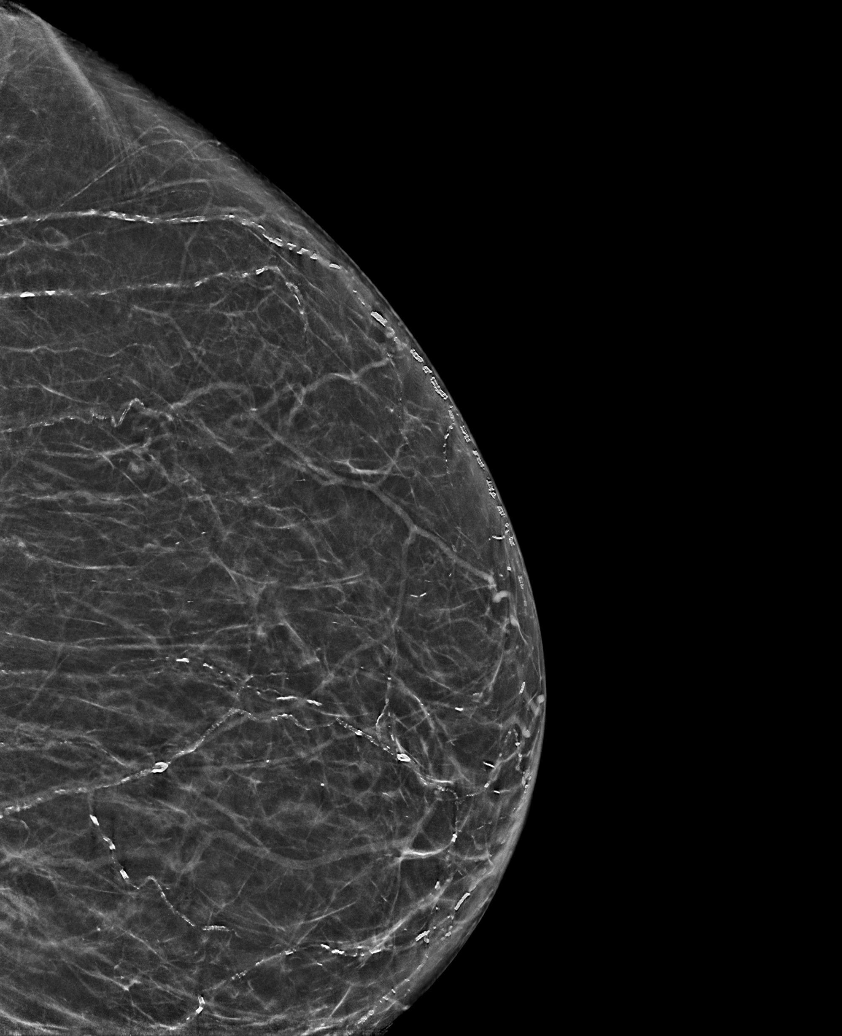

[R MLO synth-2D (1 of 2)]
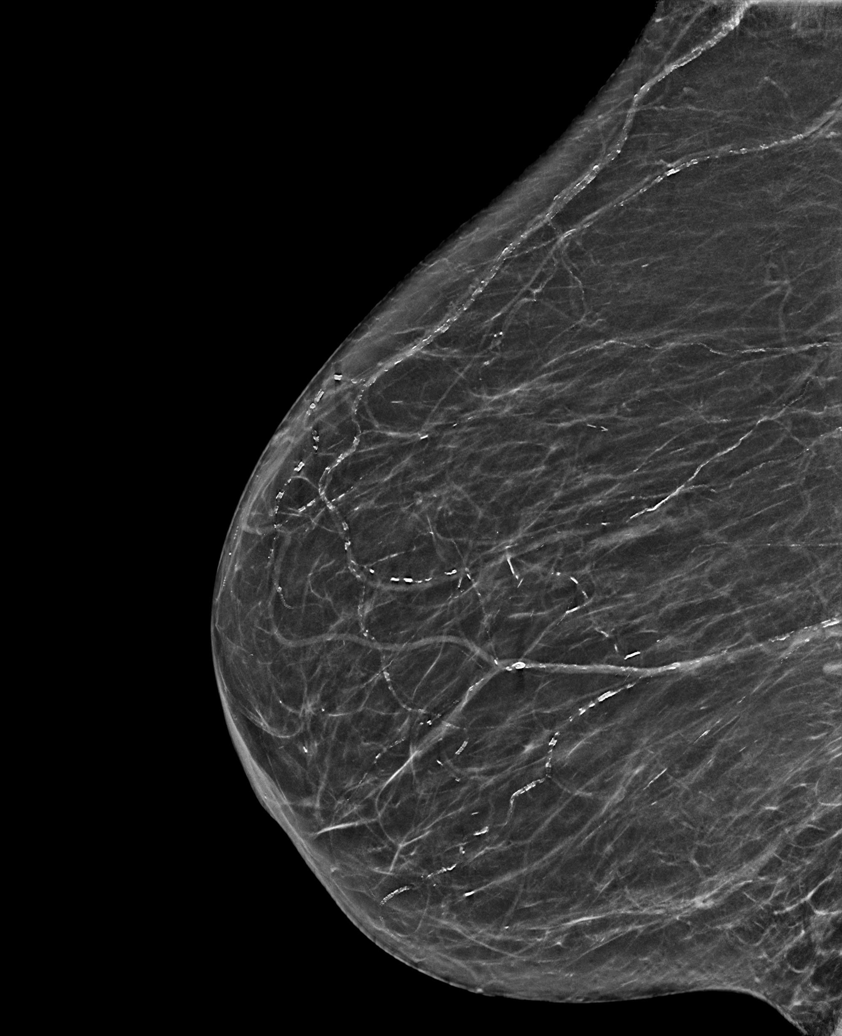

[R CC synth-2D]
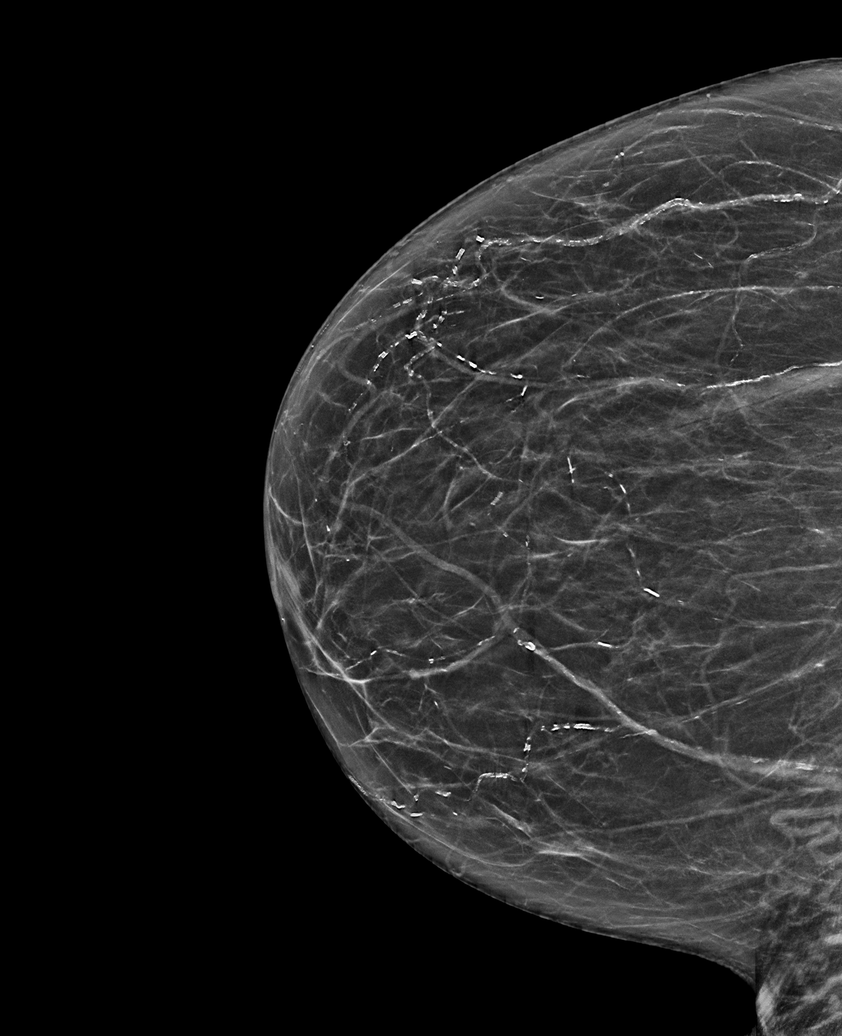

[L CC synth-2D]
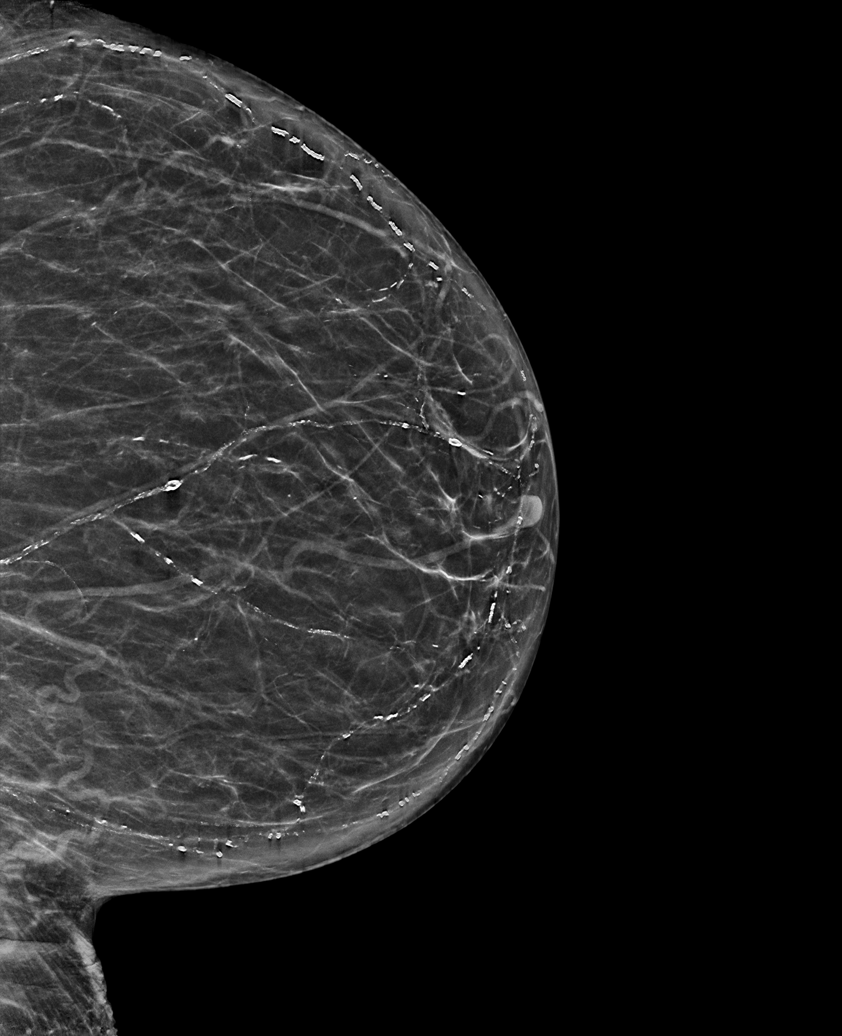

[R MLO synth-2D (2 of 2)]
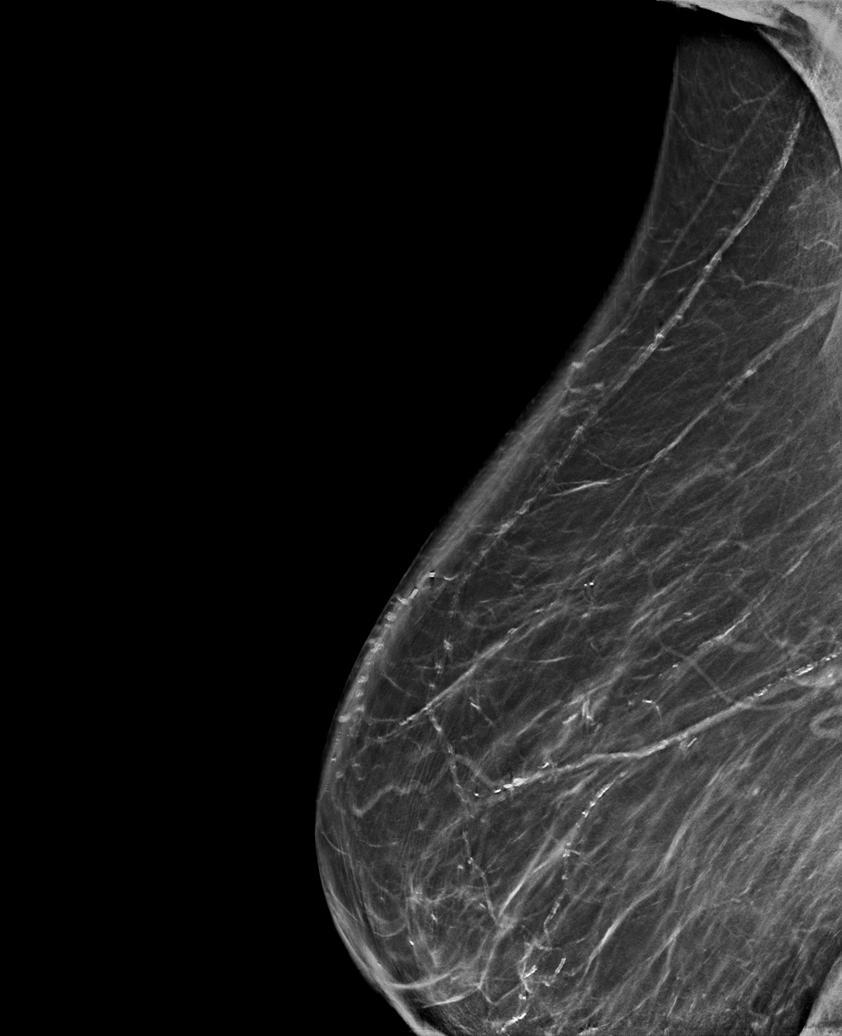

[6 of 36 positions shown; findings below may reference images not displayed]

ACR Breast Density Category b: There are scattered areas of
fibroglandular density.
FINDINGS: There are no findings suspicious for malignancy. Images were
processed with CAD.
IMPRESSION: No mammographic evidence of malignancy. A result letter of this
screening mammogram will be mailed directly to the patient.

RECOMMENDATION:
Screening mammogram in one year. (Code:[id number]-775)

BI-RADS CATEGORY  1: Negative.

## 2023-05-21 ENCOUNTER — Ambulatory Visit (INDEPENDENT_AMBULATORY_CARE_PROVIDER_SITE_OTHER): Payer: 59 | Admitting: Urology

## 2023-05-21 VITALS — BP 175/75 | HR 85 | Ht <= 58 in | Wt 120.0 lb

## 2023-05-21 DIAGNOSIS — N3941 Urge incontinence: Secondary | ICD-10-CM | POA: Diagnosis not present

## 2023-05-21 DIAGNOSIS — N3281 Overactive bladder: Secondary | ICD-10-CM | POA: Diagnosis not present

## 2023-05-21 DIAGNOSIS — R351 Nocturia: Secondary | ICD-10-CM | POA: Diagnosis not present

## 2023-05-21 DIAGNOSIS — R32 Unspecified urinary incontinence: Secondary | ICD-10-CM

## 2023-05-21 LAB — BLADDER SCAN AMB NON-IMAGING: Scan Result: 24

## 2023-05-21 MED ORDER — GEMTESA 75 MG PO TABS: 75.0000 mg | ORAL_TABLET | Freq: Every day | ORAL | 0 refills | Status: DC

## 2023-05-21 MED ORDER — GEMTESA 75 MG PO TABS: 75.0000 mg | ORAL_TABLET | Freq: Every day | ORAL | 0 refills | Status: AC

## 2023-05-21 NOTE — Progress Notes (Unsigned)
Pt declined translator, states she would like her daughter to translate throughout the visit. Provider made aware

## 2023-05-21 NOTE — Progress Notes (Signed)
I,Dina M Abdulla,acting as a scribe for Vanna Scotland, MD.,have documented all relevant documentation on the behalf of Vanna Scotland, MD,as directed by  Vanna Scotland, MD while in the presence of Vanna Scotland, MD.  05/21/2023 2:29 PM   [name] 05-09-1936 [id number]  Referring provider: Gracelyn Nurse, MD [address] [place],  [place] [address]  Chief Complaint  Patient presents with   New Patient (Initial Visit)    HPI: 87 year old female who presents today for further evaluation of urinary incontinence. She is accompanied by her daughter today and has declined a Engineer, structural.  Notably, she was seen back in 2018 for these same issues. She has a personal history of vaginal sling placed by Dr. Farrel Conners for stress urinary incontinence back in 2019. At that time, she was also complaining of nocturia 3-4 times per night, She also has known vaginal atrophy, presumably related to untreated sleep apnea.  She was not able to give [place] a urinalysis today, she voided just prior to coming here. Her urinalysis from March 2024 was negative. She is currently on Oxybutynin.  Her daughter reports today that she goes to the bathroom more than 15 times per night, about every 30 minutes.  She is no longer having stress incontinence.  Her daytime symptoms are fairly minimal.  She denies any dysuria or gross hematuria.  No issues with urinary tract infections.  She does not feel like the oxybutynin has helped her at all.  She is having intermittent episodes of confusion per her daughter.  She notes that she has COPD and uses an oxygen machine, but refuses to comply with CPAP use.  She is unable to void for [place] today.  Results for orders placed or performed in visit on 05/21/23  BLADDER SCAN AMB NON-IMAGING  Result Value Ref Range   Scan Result 24 ml      PMH: Past Medical History:  Diagnosis Date   Abdominal mass    AR (allergic rhinitis)    Asthma    GERD (gastroesophageal  reflux disease)    H. pylori infection    completed atb 07/03/2015   Hernia, epigastric    Hypercholesteremia    Hypothyroidism    Osteoporosis    Thyroid disease    hypo   Vaginal enterocele    Vulvar leukoplakia     Surgical History: Past Surgical History:  Procedure Laterality Date   ABDOMINAL HYSTERECTOMY     BREAST EXCISIONAL BIOPSY Right 1970'S   negative    COLONOSCOPY WITH PROPOFOL N/A 08/07/2015   Procedure: COLONOSCOPY WITH PROPOFOL;  Surgeon: Elnita Maxwell, MD;  Location: [facility] ENDOSCOPY;  Service: Endoscopy;  Laterality: N/A;   ESOPHAGOGASTRODUODENOSCOPY (EGD) WITH PROPOFOL N/A 08/07/2015   Procedure: ESOPHAGOGASTRODUODENOSCOPY (EGD) WITH PROPOFOL;  Surgeon: Elnita Maxwell, MD;  Location: [facility] ENDOSCOPY;  Service: Endoscopy;  Laterality: N/A;   HERNIA REPAIR      Home Medications:  Allergies as of 05/21/2023       Reactions   Shellfish Allergy Anaphylaxis   seafood   Sulfa Antibiotics Anaphylaxis, Nausea And Vomiting   Latex Rash        Medication List        Accurate as of May 21, 2023  2:29 PM. If you have any questions, ask your nurse or doctor.          albuterol 108 (90 Base) MCG/ACT inhaler Commonly known as: VENTOLIN HFA Inhale 2 puffs into the lungs every 6 (six) hours as needed  for wheezing or shortness of breath.   amLODipine 2.5 MG tablet Commonly known as: NORVASC Take 1 tablet by mouth daily.   Anoro Ellipta 62.5-25 MCG/INH Aepb Generic drug: umeclidinium-vilanterol Inhale into the lungs.   Crestor 10 MG tablet Generic drug: rosuvastatin   Dexilant 60 MG capsule Generic drug: dexlansoprazole   estradiol 0.1 MG/GM vaginal cream Commonly known as: ESTRACE VAGINAL Apply 0.5mg  (pea-sized amount)  just inside the vaginal introitus with a finger-tip every night for two weeks and then Monday, Wednesday and Friday nights.   Gemtesa 75 MG Tabs Generic drug: Vibegron Take 1 tablet (75 mg total) by mouth daily.    levothyroxine 125 MCG tablet Commonly known as: SYNTHROID 112 mcg.   oxybutynin 5 MG 24 hr tablet Commonly known as: DITROPAN-XL Take 1 tablet by mouth daily. What changed: Another medication with the same name was removed. Continue taking this medication, and follow the directions you see here.   pantoprazole 40 MG tablet Commonly known as: PROTONIX Take 40 mg by mouth daily.   Trelegy Ellipta 100-62.5-25 MCG/ACT Aepb Generic drug: Fluticasone-Umeclidin-Vilant Inhale into the lungs.        Allergies:  Allergies  Allergen Reactions   Shellfish Allergy Anaphylaxis    seafood   Sulfa Antibiotics Anaphylaxis and Nausea And Vomiting   Latex Rash    Family History: Family History  Problem Relation Age of Onset   Breast cancer Sister 9   Ovarian cancer Sister    Tuberculosis Mother    Breast cancer Sister    Diabetes Neg Hx    Heart disease Neg Hx    Prostate cancer Neg Hx    Kidney disease Neg Hx    Kidney cancer Neg Hx     Social History:  reports that she has never smoked. She has never used smokeless tobacco. She reports that she does not drink alcohol and does not use drugs.   Physical Exam: BP (!) 175/75   Pulse 85   Ht 4\' 2"  (1.27 m)   Wt 120 lb (54.4 kg)   BMI 33.75 kg/m   Constitutional:  Alert and oriented, No acute distress. HEENT: [place] AT, moist mucus membranes.  Trachea midline, no masses. Neurologic: Grossly intact, no focal deficits, moving all 4 extremities. Psychiatric: Normal mood and affect.  Assessment & Plan:    Urinary incontinence (OAB/urge incontinence)/ nocturia -Discontinue Oxybutynin due to inefficacy and possible contribution to cognitive issues, contraindicated in geriatric patients  -Begin trial of Gemtesa 75 mg for 1 month. Samples given in office today.  -Advised on importance of treating sleep apnea in relation to nocturia. Recommended limiting fluid intake before bedtime.  Given that her symptoms are primarily at  nighttime, I do feel there is a strong component of this.  Strongly urged her to follow back up with her primary care to discuss the option of resuming treatment for sleep apnea.  -Emptying her bladder adequately today.  Unable to give a urine today, will ensure that she is able to provide a sample at next visit to rule out underlying bladder pathologies.  Notably, she does have a smoking history.  -Follow up with Carollee Herter in 4-6 weeks.  I have reviewed the above documentation for accuracy and completeness, and I agree with the above.   Vanna Scotland, MD    [facility] [address] [place], [place] [address] [phone]

## 2023-06-17 ENCOUNTER — Other Ambulatory Visit: Payer: Self-pay | Admitting: Urology

## 2023-06-24 NOTE — Progress Notes (Unsigned)
06/25/2023 4:49 PM   Cassandra Gomez [birth year] [id number]  Referring provider: Gracelyn Nurse, MD [address] [place],  [place] [address]  Urological history: 1. Mixed incontinence -contributing factors of age, GSM, Pelvic surgery, pelvic prolapse, sleep apnea (not using CPAP) and asthma -sling (2019)   Chief Complaint  Patient presents with   Urinary Incontinence   HPI: Cassandra Gomez is a 87 y.o. female who presents today for 6 week follow up after a trial of gemtesa.    Previous records reviewed.   She is having 1-7 daytime voids with nocturia x 3.  With a severe urge to urinate.  She has urge incontinence.  She is leaking 3 more times a day.  She wears absorbent pads daily.  She does limit her fluid intake.  She does not engage in toilet mapping.  Patient denies any modifying or aggravating factors.  Patient denies any recent UTI's, gross hematuria, dysuria or suprapubic/flank pain.  Patient denies any fevers, chills, nausea or vomiting.    PVR 0 mL   PMH: Past Medical History:  Diagnosis Date   Abdominal mass    AR (allergic rhinitis)    Asthma    GERD (gastroesophageal reflux disease)    H. pylori infection    completed atb 07/03/2015   Hernia, epigastric    Hypercholesteremia    Hypothyroidism    Osteoporosis    Thyroid disease    hypo   Vaginal enterocele    Vulvar leukoplakia     Surgical History: Past Surgical History:  Procedure Laterality Date   ABDOMINAL HYSTERECTOMY     BREAST EXCISIONAL BIOPSY Right 1970'S   negative    COLONOSCOPY WITH PROPOFOL N/A 08/07/2015   Procedure: COLONOSCOPY WITH PROPOFOL;  Surgeon: Elnita Maxwell, MD;  Location[facility] ENDOSCOPY;  Service: Endoscopy;  Laterality: N/A;   ESOPHAGOGASTRODUODENOSCOPY (EGD) WITH PROPOFOL N/A 08/07/2015   Procedure: ESOPHAGOGASTRODUODENOSCOPY (EGD) WITH PROPOFOL;  Surgeon: Elnita Maxwell, MD;  Location: [facility] ENDOSCOPY;  Service: Endoscopy;  Laterality: N/A;   HERNIA REPAIR       Home Medications:  Allergies as of 06/25/2023       Reactions   Shellfish Allergy Anaphylaxis   seafood   Sulfa Antibiotics Anaphylaxis, Nausea And Vomiting   Latex Rash        Medication List        Accurate as of June 25, 2023  4:49 PM. If you have any questions, ask your nurse or doctor.          albuterol 108 (90 Base) MCG/ACT inhaler Commonly known as: VENTOLIN HFA Inhale 2 puffs into the lungs every 6 (six) hours as needed for wheezing or shortness of breath.   amLODipine 2.5 MG tablet Commonly known as: NORVASC Take 1 tablet by mouth daily.   Anoro Ellipta 62.5-25 MCG/INH Aepb Generic drug: umeclidinium-vilanterol Inhale into the lungs.   Crestor 10 MG tablet Generic drug: rosuvastatin   Dexilant 60 MG capsule Generic drug: dexlansoprazole   estradiol 0.1 MG/GM vaginal cream Commonly known as: ESTRACE VAGINAL Apply 0.5mg  (pea-sized amount)  just inside the vaginal introitus with a finger-tip every night for two weeks and then Monday, Wednesday and Friday nights.   Gemtesa 75 MG Tabs Generic drug: Vibegron Take 1 tablet (75 mg total) by mouth daily.   levothyroxine 125 MCG tablet Commonly known as: SYNTHROID 112 mcg.   oxybutynin 5 MG 24 hr tablet Commonly known as: DITROPAN-XL Take 1 tablet by mouth daily.   pantoprazole 40  MG tablet Commonly known as: PROTONIX Take 40 mg by mouth daily.   Trelegy Ellipta 100-62.5-25 MCG/ACT Aepb Generic drug: Fluticasone-Umeclidin-Vilant Inhale into the lungs.        Allergies:  Allergies  Allergen Reactions   Shellfish Allergy Anaphylaxis    seafood   Sulfa Antibiotics Anaphylaxis and Nausea And Vomiting   Latex Rash    Family History: Family History  Problem Relation Age of Onset   Breast cancer Sister 82   Ovarian cancer Sister    Tuberculosis Mother    Breast cancer Sister    Diabetes Neg Hx    Heart disease Neg Hx    Prostate cancer Neg Hx    Kidney disease Neg Hx     Kidney cancer Neg Hx     Social History:  reports that she has never smoked. She has never used smokeless tobacco. She reports that she does not drink alcohol and does not use drugs.  ROS: Pertinent ROS in HPI  Physical Exam: BP 135/82   Pulse 84   Ht 4\' 2"  (1.27 m)   Wt 120 lb (54.4 kg)   BMI 33.75 kg/m   Constitutional:  Well nourished. Alert and oriented, No acute distress. HEENT: [place] AT, moist mucus membranes.  Trachea midline Cardiovascular: No clubbing, cyanosis, or edema. Respiratory: Normal respiratory effort, no increased work of breathing. Neurologic: Grossly intact, no focal deficits, moving all 4 extremities. Psychiatric: Normal mood and affect.    Laboratory Data: Basic Metabolic Panel (BMP) Order: [id number] Component Ref Range & Units 2 mo ago  Glucose 70 - 110 mg/dL 86  Sodium 409 - 811 mmol/L 138  Potassium 3.6 - 5.1 mmol/L 4.7  Chloride 97 - 109 mmol/L 102  Carbon Dioxide (CO2) 22.0 - 32.0 mmol/L 29.2  Calcium 8.7 - 10.3 mg/dL 9.7  Urea Nitrogen (BUN) 7 - 25 mg/dL 14  Creatinine 0.6 - 1.1 mg/dL 0.6  Glomerular Filtration Rate (eGFR) >60 mL/min/1.73sq m 88  Comment: CKD-EPI (2021) does not include patient's race in the calculation of eGFR.  Monitoring changes of plasma creatinine and eGFR over time is useful for monitoring kidney function.  Interpretive Ranges for eGFR (CKD-EPI 2021):  eGFR:       >60 mL/min/1.73 sq. m - Normal eGFR:       30-59 mL/min/1.73 sq. m - Moderately Decreased eGFR:       15-29 mL/min/1.73 sq. m  - Severely Decreased eGFR:       < 15 mL/min/1.73 sq. m  - Kidney Failure   Note: These eGFR calculations do not apply in acute situations when eGFR is changing rapidly or patients on dialysis.  BUN/Crea Ratio 6.0 - 20.0 23.3 High   Anion Gap w/K 6.0 - 16.0 11.5  Resulting Agency [facility] - LAB   Specimen Collected: 04/23/23 08:56   Performed by: [facility] - LAB Last Resulted: 04/23/23 11:43   Received From: [facility]  Result Received: 04/30/23 10:35  I have reviewed the labs.   Pertinent Imaging:  06/25/23 11:10  Scan Result 0 ml    Assessment & Plan:    1. Mixed incontinence -she feels the Leslye Peer was helpful in reducing her urinary symptoms -her daughter said a prescription was sent in so they do not need refills at this time -her mother was not able to give [place] a urine specimen today, so she will bring 1 back this afternoon   2. Nocturia -Encouraged to see primary care physician to address  her issues of sleep apnea  Return for pending urinalysis.  These notes generated with voice recognition software. I apologize for typographical errors.  Cloretta Ned  [facility] [address]  [address] [place], [place] [address] [phone]

## 2023-06-25 ENCOUNTER — Encounter: Payer: Self-pay | Admitting: Urology

## 2023-06-25 ENCOUNTER — Ambulatory Visit (INDEPENDENT_AMBULATORY_CARE_PROVIDER_SITE_OTHER): Payer: 59 | Admitting: Urology

## 2023-06-25 VITALS — BP 135/82 | HR 84 | Ht <= 58 in | Wt 120.0 lb

## 2023-06-25 DIAGNOSIS — N3946 Mixed incontinence: Secondary | ICD-10-CM

## 2023-06-25 DIAGNOSIS — R351 Nocturia: Secondary | ICD-10-CM | POA: Diagnosis not present

## 2023-06-25 DIAGNOSIS — R32 Unspecified urinary incontinence: Secondary | ICD-10-CM

## 2023-06-25 LAB — BLADDER SCAN AMB NON-IMAGING: Scan Result: 0

## 2023-09-08 ENCOUNTER — Telehealth: Payer: Self-pay | Admitting: Urology

## 2023-09-08 NOTE — Telephone Encounter (Signed)
Pt would like a refill for [facility] sent [facility] in [place]

## 2024-01-06 ENCOUNTER — Other Ambulatory Visit: Payer: Self-pay | Admitting: Internal Medicine

## 2024-01-06 DIAGNOSIS — R1011 Right upper quadrant pain: Secondary | ICD-10-CM

## 2024-01-07 ENCOUNTER — Ambulatory Visit
Admission: RE | Admit: 2024-01-07 | Discharge: 2024-01-07 | Disposition: A | Discharge status: Home / Self Care | Source: Ambulatory Visit | Attending: Internal Medicine | Admitting: Internal Medicine

## 2024-01-07 DIAGNOSIS — R1011 Right upper quadrant pain: Secondary | ICD-10-CM | POA: Diagnosis present

## 2024-04-23 ENCOUNTER — Other Ambulatory Visit: Payer: Self-pay | Admitting: Internal Medicine

## 2024-04-23 DIAGNOSIS — R1011 Right upper quadrant pain: Secondary | ICD-10-CM

## 2024-05-03 ENCOUNTER — Other Ambulatory Visit: Payer: Self-pay | Admitting: Infectious Diseases

## 2024-05-03 DIAGNOSIS — R1011 Right upper quadrant pain: Secondary | ICD-10-CM

## 2024-05-19 ENCOUNTER — Ambulatory Visit
Admission: RE | Admit: 2024-05-19 | Discharge: 2024-05-19 | Disposition: A | Discharge status: Home / Self Care | Source: Ambulatory Visit | Attending: Infectious Diseases | Admitting: Infectious Diseases

## 2024-05-19 DIAGNOSIS — R1011 Right upper quadrant pain: Secondary | ICD-10-CM | POA: Diagnosis present

## 2024-05-19 MED ORDER — TECHNETIUM TC 99M MEBROFENIN IV KIT
5.0000 | PACK | Freq: Once | INTRAVENOUS | Status: AC | PRN
  Administered 2024-05-19: 5.25 via INTRAVENOUS

## 2024-05-24 ENCOUNTER — Encounter: Payer: Self-pay | Admitting: Internal Medicine

## 2024-05-24 ENCOUNTER — Other Ambulatory Visit: Payer: Self-pay | Admitting: Internal Medicine

## 2024-05-24 DIAGNOSIS — R1011 Right upper quadrant pain: Secondary | ICD-10-CM

## 2024-05-27 ENCOUNTER — Other Ambulatory Visit

## 2024-06-01 ENCOUNTER — Ambulatory Visit
Admission: RE | Admit: 2024-06-01 | Discharge: 2024-06-01 | Disposition: A | Discharge status: Home / Self Care | Source: Ambulatory Visit | Attending: Internal Medicine | Admitting: Internal Medicine

## 2024-06-01 DIAGNOSIS — R1011 Right upper quadrant pain: Secondary | ICD-10-CM | POA: Insufficient documentation

## 2024-06-01 MED ORDER — IOHEXOL 300 MG/ML  SOLN
80.0000 mL | Freq: Once | INTRAMUSCULAR | Status: AC | PRN
  Administered 2024-06-01: 80 mL via INTRAVENOUS
